# Patient Record
Sex: Female | Born: 1950 | Race: Black or African American | Hispanic: No | Marital: Married | Smoking: Former smoker
Health system: Southern US, Community
[De-identification: ages and names within clinical notes are randomized; demographics above are authoritative.]

## PROBLEM LIST (undated history)

## (undated) DIAGNOSIS — I10 Essential (primary) hypertension: Secondary | ICD-10-CM

## (undated) DIAGNOSIS — E7800 Pure hypercholesterolemia, unspecified: Secondary | ICD-10-CM

## (undated) DIAGNOSIS — E079 Disorder of thyroid, unspecified: Secondary | ICD-10-CM

## (undated) DIAGNOSIS — F0390 Unspecified dementia without behavioral disturbance: Secondary | ICD-10-CM

## (undated) DIAGNOSIS — E119 Type 2 diabetes mellitus without complications: Secondary | ICD-10-CM

## (undated) HISTORY — PX: THYROIDECTOMY: SHX17

## (undated) HISTORY — PX: ABDOMINAL HYSTERECTOMY: SHX81

---

## 1997-05-30 ENCOUNTER — Encounter: Admission: RE | Admit: 1997-05-30 | Discharge: 1997-08-28 | Payer: Self-pay | Admitting: Internal Medicine

## 2003-03-19 ENCOUNTER — Encounter: Admission: RE | Admit: 2003-03-19 | Discharge: 2003-05-15 | Payer: Self-pay | Admitting: Occupational Medicine

## 2003-04-02 ENCOUNTER — Emergency Department (HOSPITAL_COMMUNITY): Admission: EM | Admit: 2003-04-02 | Discharge: 2003-04-02 | Payer: Self-pay | Admitting: Family Medicine

## 2006-08-02 ENCOUNTER — Emergency Department (HOSPITAL_COMMUNITY): Admission: EM | Admit: 2006-08-02 | Discharge: 2006-08-02 | Payer: Self-pay | Admitting: Emergency Medicine

## 2006-08-03 ENCOUNTER — Ambulatory Visit (HOSPITAL_COMMUNITY): Admission: RE | Admit: 2006-08-03 | Discharge: 2006-08-03 | Payer: Self-pay | Admitting: Emergency Medicine

## 2007-06-25 ENCOUNTER — Emergency Department (HOSPITAL_BASED_OUTPATIENT_CLINIC_OR_DEPARTMENT_OTHER): Admission: EM | Admit: 2007-06-25 | Discharge: 2007-06-25 | Payer: Self-pay | Admitting: Emergency Medicine

## 2007-09-23 ENCOUNTER — Emergency Department (HOSPITAL_BASED_OUTPATIENT_CLINIC_OR_DEPARTMENT_OTHER): Admission: EM | Admit: 2007-09-23 | Discharge: 2007-09-23 | Payer: Self-pay | Admitting: Emergency Medicine

## 2008-02-17 ENCOUNTER — Ambulatory Visit: Payer: Self-pay | Admitting: Diagnostic Radiology

## 2008-02-17 ENCOUNTER — Emergency Department (HOSPITAL_BASED_OUTPATIENT_CLINIC_OR_DEPARTMENT_OTHER): Admission: EM | Admit: 2008-02-17 | Discharge: 2008-02-18 | Payer: Self-pay | Admitting: Emergency Medicine

## 2008-12-19 ENCOUNTER — Emergency Department (HOSPITAL_BASED_OUTPATIENT_CLINIC_OR_DEPARTMENT_OTHER): Admission: EM | Admit: 2008-12-19 | Discharge: 2008-12-19 | Payer: Self-pay | Admitting: Emergency Medicine

## 2008-12-19 ENCOUNTER — Ambulatory Visit: Payer: Self-pay | Admitting: Diagnostic Radiology

## 2009-07-10 ENCOUNTER — Emergency Department (HOSPITAL_BASED_OUTPATIENT_CLINIC_OR_DEPARTMENT_OTHER): Admission: EM | Admit: 2009-07-10 | Discharge: 2009-07-10 | Payer: Self-pay | Admitting: Emergency Medicine

## 2009-07-11 ENCOUNTER — Ambulatory Visit: Payer: Self-pay | Admitting: Diagnostic Radiology

## 2009-07-11 ENCOUNTER — Emergency Department (HOSPITAL_BASED_OUTPATIENT_CLINIC_OR_DEPARTMENT_OTHER): Admission: EM | Admit: 2009-07-11 | Discharge: 2009-07-12 | Payer: Self-pay | Admitting: Emergency Medicine

## 2010-04-14 LAB — DIFFERENTIAL
Eosinophils Absolute: 0.1 10*3/uL (ref 0.0–0.7)
Eosinophils Relative: 1 % (ref 0–5)
Lymphocytes Relative: 43 % (ref 12–46)
Neutro Abs: 2.8 10*3/uL (ref 1.7–7.7)
Neutrophils Relative %: 43 % (ref 43–77)

## 2010-04-14 LAB — URINALYSIS, ROUTINE W REFLEX MICROSCOPIC
Bilirubin Urine: NEGATIVE
Ketones, ur: NEGATIVE mg/dL
Protein, ur: NEGATIVE mg/dL
Specific Gravity, Urine: 1.019 (ref 1.005–1.030)
Urobilinogen, UA: 1 mg/dL (ref 0.0–1.0)
pH: 6.5 (ref 5.0–8.0)

## 2010-04-14 LAB — BASIC METABOLIC PANEL
BUN: 12 mg/dL (ref 6–23)
CO2: 24 mEq/L (ref 19–32)
Calcium: 9.2 mg/dL (ref 8.4–10.5)
Chloride: 108 mEq/L (ref 96–112)
Creatinine, Ser: 0.8 mg/dL (ref 0.4–1.2)
GFR calc non Af Amer: >60 mL/min (ref >60)
Potassium: 4 mEq/L (ref 3.5–5.1)
Sodium: 143 mEq/L (ref 135–145)

## 2010-04-14 LAB — CBC
HCT: 38.4 % (ref 36.0–46.0)
RBC: 4.3 MIL/uL (ref 3.87–5.11)
RDW: 13.6 % (ref 11.5–15.5)
WBC: 6.5 10*3/uL (ref 4.0–10.5)

## 2010-04-14 LAB — POCT CARDIAC MARKERS: CKMB, poc: <1 ng/mL — ABNORMAL LOW (ref 1.0–8.0)

## 2010-04-14 LAB — GLUCOSE, CAPILLARY: Glucose-Capillary: 259 mg/dL — ABNORMAL HIGH (ref 70–99)

## 2010-10-08 LAB — POCT CARDIAC MARKERS
CKMB, poc: 2.2
Myoglobin, poc: 31
Operator id: 5507

## 2010-10-08 LAB — BASIC METABOLIC PANEL
BUN: 15
Calcium: 9.6
GFR calc Af Amer: >60
GFR calc non Af Amer: >60
Glucose, Bld: 312 — ABNORMAL HIGH
Potassium: 4

## 2010-10-08 LAB — DIFFERENTIAL
Basophils Absolute: 0
Eosinophils Absolute: 0
Eosinophils Relative: 1

## 2010-10-08 LAB — CBC
HCT: 37.7
Hemoglobin: 12.7
RBC: 4.34
RDW: 13.4

## 2010-10-14 LAB — COMPREHENSIVE METABOLIC PANEL
ALT: 44 — ABNORMAL HIGH
AST: 35
Alkaline Phosphatase: 79
Chloride: 102
Creatinine, Ser: 0.6
GFR calc Af Amer: >60
Sodium: 139
Total Bilirubin: 0.3

## 2010-10-14 LAB — DIFFERENTIAL
Basophils Absolute: 0
Basophils Relative: 1
Eosinophils Absolute: 0
Monocytes Absolute: 0.5
Neutro Abs: 3.2

## 2010-10-14 LAB — POCT B-TYPE NATRIURETIC PEPTIDE (BNP): B Natriuretic Peptide, POC: 11.1

## 2010-10-14 LAB — CBC
HCT: 38.3
MCHC: 33.6
RDW: 13.4

## 2010-10-26 LAB — CBC
HCT: 38.2
Hemoglobin: 12.9
MCV: 85.3
Platelets: 272
RBC: 4.47
WBC: 6.3

## 2010-10-26 LAB — COMPREHENSIVE METABOLIC PANEL
Alkaline Phosphatase: 70
BUN: 12
CO2: 28
Chloride: 103
Creatinine, Ser: 0.65
GFR calc non Af Amer: >60
Glucose, Bld: 148 — ABNORMAL HIGH
Total Bilirubin: 0.6

## 2010-10-26 LAB — URINALYSIS, ROUTINE W REFLEX MICROSCOPIC
Nitrite: NEGATIVE
Protein, ur: NEGATIVE
Specific Gravity, Urine: 1.022
Urobilinogen, UA: 0.2

## 2010-10-26 LAB — DIFFERENTIAL
Basophils Absolute: 0
Basophils Relative: 1
Lymphocytes Relative: 49 — ABNORMAL HIGH
Neutro Abs: 2.6
Neutrophils Relative %: 41 — ABNORMAL LOW

## 2010-10-26 LAB — LIPASE, BLOOD: Lipase: 15

## 2013-04-18 ENCOUNTER — Other Ambulatory Visit: Payer: Self-pay | Admitting: Internal Medicine

## 2015-02-22 ENCOUNTER — Emergency Department (HOSPITAL_BASED_OUTPATIENT_CLINIC_OR_DEPARTMENT_OTHER): Payer: Medicare Other

## 2015-02-22 ENCOUNTER — Encounter (HOSPITAL_BASED_OUTPATIENT_CLINIC_OR_DEPARTMENT_OTHER): Payer: Self-pay | Admitting: Emergency Medicine

## 2015-02-22 ENCOUNTER — Emergency Department (HOSPITAL_BASED_OUTPATIENT_CLINIC_OR_DEPARTMENT_OTHER)
Admission: EM | Admit: 2015-02-22 | Discharge: 2015-02-22 | Disposition: A | Discharge status: Home / Self Care | Payer: Medicare Other | Attending: Emergency Medicine | Admitting: Emergency Medicine

## 2015-02-22 DIAGNOSIS — Z79899 Other long term (current) drug therapy: Secondary | ICD-10-CM | POA: Insufficient documentation

## 2015-02-22 DIAGNOSIS — I10 Essential (primary) hypertension: Secondary | ICD-10-CM | POA: Diagnosis not present

## 2015-02-22 DIAGNOSIS — R1110 Vomiting, unspecified: Secondary | ICD-10-CM | POA: Insufficient documentation

## 2015-02-22 DIAGNOSIS — E119 Type 2 diabetes mellitus without complications: Secondary | ICD-10-CM | POA: Diagnosis not present

## 2015-02-22 DIAGNOSIS — Z7902 Long term (current) use of antithrombotics/antiplatelets: Secondary | ICD-10-CM | POA: Insufficient documentation

## 2015-02-22 DIAGNOSIS — K5900 Constipation, unspecified: Secondary | ICD-10-CM | POA: Diagnosis not present

## 2015-02-22 DIAGNOSIS — E079 Disorder of thyroid, unspecified: Secondary | ICD-10-CM | POA: Insufficient documentation

## 2015-02-22 DIAGNOSIS — Z87891 Personal history of nicotine dependence: Secondary | ICD-10-CM | POA: Insufficient documentation

## 2015-02-22 HISTORY — DX: Pure hypercholesterolemia, unspecified: E78.00

## 2015-02-22 HISTORY — DX: Type 2 diabetes mellitus without complications: E11.9

## 2015-02-22 HISTORY — DX: Disorder of thyroid, unspecified: E07.9

## 2015-02-22 HISTORY — DX: Essential (primary) hypertension: I10

## 2015-02-22 LAB — CBC WITH DIFFERENTIAL/PLATELET
BASOS ABS: 0 10*3/uL (ref 0.0–0.1)
Basophils Relative: 0 %
EOS PCT: 2 %
Eosinophils Absolute: 0.1 10*3/uL (ref 0.0–0.7)
HCT: 37.7 % (ref 36.0–46.0)
Hemoglobin: 12.3 g/dL (ref 12.0–15.0)
LYMPHS PCT: 41 %
Lymphs Abs: 1.6 10*3/uL (ref 0.7–4.0)
MCH: 28.9 pg (ref 26.0–34.0)
MCHC: 32.6 g/dL (ref 30.0–36.0)
MCV: 88.5 fL (ref 78.0–100.0)
MONO ABS: 0.5 10*3/uL (ref 0.1–1.0)
MONOS PCT: 12 %
Neutro Abs: 1.7 10*3/uL (ref 1.7–7.7)
Neutrophils Relative %: 45 %
PLATELETS: 219 10*3/uL (ref 150–400)
RBC: 4.26 MIL/uL (ref 3.87–5.11)
RDW: 14.6 % (ref 11.5–15.5)
WBC: 3.8 10*3/uL — ABNORMAL LOW (ref 4.0–10.5)

## 2015-02-22 LAB — LIPASE, BLOOD: Lipase: 23 U/L (ref 11–51)

## 2015-02-22 LAB — COMPREHENSIVE METABOLIC PANEL
ALBUMIN: 3.9 g/dL (ref 3.5–5.0)
ALK PHOS: 45 U/L (ref 38–126)
ALT: 11 U/L — ABNORMAL LOW (ref 14–54)
AST: 17 U/L (ref 15–41)
Anion gap: 7 (ref 5–15)
BILIRUBIN TOTAL: 0.6 mg/dL (ref 0.3–1.2)
BUN: 13 mg/dL (ref 6–20)
CALCIUM: 9.2 mg/dL (ref 8.9–10.3)
CO2: 28 mmol/L (ref 22–32)
Chloride: 105 mmol/L (ref 101–111)
Creatinine, Ser: 0.66 mg/dL (ref 0.44–1.00)
GFR calc Af Amer: >60 mL/min (ref >60)
GFR calc non Af Amer: >60 mL/min (ref >60)
GLUCOSE: 105 mg/dL — AB (ref 65–99)
Potassium: 3.9 mmol/L (ref 3.5–5.1)
Sodium: 140 mmol/L (ref 135–145)
TOTAL PROTEIN: 6.4 g/dL — AB (ref 6.5–8.1)

## 2015-02-22 LAB — URINALYSIS, ROUTINE W REFLEX MICROSCOPIC
BILIRUBIN URINE: NEGATIVE
GLUCOSE, UA: NEGATIVE mg/dL
HGB URINE DIPSTICK: NEGATIVE
KETONES UR: NEGATIVE mg/dL
Leukocytes, UA: NEGATIVE
NITRITE: NEGATIVE
PH: 7 (ref 5.0–8.0)
Protein, ur: NEGATIVE mg/dL
SPECIFIC GRAVITY, URINE: 1.012 (ref 1.005–1.030)

## 2015-02-22 MED ORDER — POLYETHYLENE GLYCOL 3350 17 GM/SCOOP PO POWD: 17.0000 g | Freq: Two times a day (BID) | ORAL | Status: DC

## 2015-02-22 MED ORDER — IOHEXOL 300 MG/ML  SOLN
100.0000 mL | Freq: Once | INTRAMUSCULAR | Status: AC | PRN
  Administered 2015-02-22: 100 mL via INTRAVENOUS

## 2015-02-22 MED ORDER — LORAZEPAM 2 MG/ML IJ SOLN
1.0000 mg | Freq: Once | INTRAMUSCULAR | Status: AC
  Administered 2015-02-22: 1 mg via INTRAVENOUS
  Filled 2015-02-22: qty 1

## 2015-02-22 MED ORDER — IOHEXOL 300 MG/ML  SOLN
25.0000 mL | Freq: Once | INTRAMUSCULAR | Status: AC | PRN
  Administered 2015-02-22: 25 mL via ORAL

## 2015-02-22 NOTE — ED Provider Notes (Signed)
CSN: [id number]     Arrival date & time 02/22/15  4098 History   First MD Initiated Contact with Patient 02/22/15 0919     Chief Complaint  Patient presents with  . Constipation    April Carlson is a 65 y.o. female who presents to the ED complaining of constipation for two weeks. She reports she has not had a good BM in two weeks. She reports passing a small amount of stool yesterday. She reports nausea starting yesterday with two episodes of vomiting yesterday. She reports passing a small amount of gas yesterday. She complains of lower abdominal pain with pain that radiates into her back that feels like an urge to have a BM at a 10/10. She was seen by her PCP three days ago and was given mag citrate and colace. She has used fleet enemas as well without BM. She takes percocet intermittently for sciatica. Previous abdominal surgical history includes complete hysterectomy. She denies fevers, urinary symptoms, hematemesis, hematochezia, or rashes.   Patient is a 65 y.o. female presenting with constipation. The history is provided by the patient. No language interpreter was used.  Constipation Associated symptoms: abdominal pain and vomiting   Associated symptoms: no back pain, no diarrhea, no dysuria, no fever and no nausea     Past Medical History  Diagnosis Date  . Thyroid disease   . Hypertension   . Diabetes mellitus without complication (HCC)   . High cholesterol    Past Surgical History  Procedure Laterality Date  . Abdominal hysterectomy     No family history on file. Social History  Substance Use Topics  . Smoking status: Former Smoker    Types: Cigarettes  . Smokeless tobacco: None  . Alcohol Use: No   OB History    No data available     Review of Systems  Constitutional: Negative for fever, chills and appetite change.  HENT: Negative for congestion and sore throat.   Eyes: Negative for visual disturbance.  Respiratory: Negative for cough and shortness of breath.    Cardiovascular: Negative for chest pain.  Gastrointestinal: Positive for vomiting, abdominal pain, constipation and rectal pain. Negative for nausea, diarrhea, blood in stool and anal bleeding.  Genitourinary: Negative for dysuria, urgency, frequency, hematuria, flank pain, decreased urine volume and difficulty urinating.  Musculoskeletal: Negative for back pain and neck pain.  Skin: Negative for rash.  Neurological: Negative for headaches.      Allergies  Review of patient's allergies indicates no known allergies.  Home Medications   Prior to Admission medications   Medication Sig Start Date End Date Taking? Authorizing Provider  alendronate (FOSAMAX) 70 MG tablet Take 70 mg by mouth once a week. Take with a full glass of water on an empty stomach.   Yes Historical Provider, MD  clopidogrel (PLAVIX) 75 MG tablet Take 75 mg by mouth daily.   Yes Historical Provider, MD  Dulaglutide (TRULICITY [place]) Inject into the skin.   Yes Historical Provider, MD  gabapentin (NEURONTIN) 100 MG capsule Take 100 mg by mouth 3 (three) times daily.   Yes Historical Provider, MD  levothyroxine (SYNTHROID, LEVOTHROID) 100 MCG tablet Take 125 mcg by mouth daily before breakfast.   Yes Historical Provider, MD  lisinopril (PRINIVIL,ZESTRIL) 10 MG tablet Take 10 mg by mouth daily.   Yes Historical Provider, MD  lisinopril-hydrochlorothiazide (PRINZIDE,ZESTORETIC) 10-12.5 MG tablet Take 1 tablet by mouth daily.   Yes Historical Provider, MD  oxyCODONE-acetaminophen (PERCOCET) 10-325 MG tablet Take 1 tablet by mouth  every 4 (four) hours as needed for pain.   Yes Historical Provider, MD  polyethylene glycol powder (GLYCOLAX/MIRALAX) powder Take 17 g by mouth 2 (two) times daily. Until daily soft stools  OTC 02/22/15   Everlene Farrier, PA-C   BP 148/90 mmHg  Pulse 77  Temp(Src) 98.3 F (36.8 C) (Oral)  Resp 18  Ht  (1.499 m)  Wt 77.111 kg  BMI 34.32 kg/m2  SpO2 100% Physical Exam  Constitutional: She  appears well-developed and well-nourished. No distress.  Nontoxic appearing.  HENT:  Head: Normocephalic and atraumatic.  Mouth/Throat: Oropharynx is clear and moist.  Eyes: Conjunctivae are normal. Pupils are equal, round, and reactive to light. Right eye exhibits no discharge. Left eye exhibits no discharge.  Neck: Neck supple.  Cardiovascular: Normal rate, regular rhythm, normal heart sounds and intact distal pulses.  Exam reveals no gallop and no friction rub.   No murmur heard. Pulmonary/Chest: Effort normal and breath sounds normal. No respiratory distress. She has no wheezes. She has no rales.  Abdominal: Soft. Bowel sounds are normal. She exhibits no distension and no mass. There is tenderness. There is no rebound and no guarding.  Abdomen soft. Bowel sounds are present. Mild suprapubic abdominal tenderness to palpation. No peritoneal signs. No CVA or flank tenderness.   Genitourinary:  Digital rectal exam performed by me with female RN chaperone. No external hemorrhoids noted. No gross bloody stool. No impaction. Soft stool in her rectal vault.  Musculoskeletal: She exhibits no edema.  Lymphadenopathy:    She has no cervical adenopathy.  Neurological: She is alert. Coordination normal.  Skin: Skin is warm and dry. No rash noted. She is not diaphoretic. No erythema. No pallor.  Psychiatric: She has a normal mood and affect. Her behavior is normal.  Nursing note and vitals reviewed.   ED Course  Procedures (including critical care time) Labs Review Labs Reviewed  COMPREHENSIVE METABOLIC PANEL - Abnormal; Notable for the following:    Glucose, Bld 105 (*)    Total Protein 6.4 (*)    ALT 11 (*)    All other components within normal limits  CBC WITH DIFFERENTIAL/PLATELET - Abnormal; Notable for the following:    WBC 3.8 (*)    All other components within normal limits  LIPASE, BLOOD  URINALYSIS, ROUTINE W REFLEX MICROSCOPIC (NOT AT [facility])    Imaging Review Ct Abdomen Pelvis  W Contrast  02/22/2015  CLINICAL DATA:  Constipation with abdominal pain EXAM: CT ABDOMEN AND PELVIS WITH CONTRAST TECHNIQUE: Multidetector CT imaging of the abdomen and pelvis was performed using the standard protocol following bolus administration of intravenous contrast. Oral contrast was also administered. CONTRAST:  OMNIPAQUE IOHEXOL 300 MG/ML SOLN, 25mL OMNIPAQUE IOHEXOL 300 MG/ML SOLN COMPARISON:  May 16, 2014 FINDINGS: Lower chest:  Lung bases are clear.  There is a small hiatal hernia. Hepatobiliary: There is a tiny cyst in the anterior segment of the right lobe of the liver near the dome. No other focal liver lesions are identified. The gallbladder wall is not appreciably thickened. There is no biliary duct dilatation. Pancreas: No pancreatic mass or inflammatory focus. Spleen: No splenic lesions are identified. Adrenals/Urinary Tract: Adrenals appear normal bilaterally. Kidneys bilaterally show no mass or hydronephrosis on either side. There is no renal or ureteral calculus on either side. The urinary bladder is midline with wall thickness within normal limits. Stomach/Bowel: There is diffuse stool throughout the colon. There are scattered sigmoid diverticula without diverticulitis. There is no bowel wall  or mesenteric thickening. There is no bowel obstruction. No free air or portal venous air. 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 There is enlargement of the left hemiazygous vein with vessel crossing to the right where the inferior vena cava fills at the level of the renal veins. There is no abdominal aortic aneurysm. The major mesenteric vessels appear patent. There are scattered foci of calcification in the aorta and common iliac arteries. There is no adenopathy in the abdomen or pelvis. There are several small calcified lymph nodes in the right common iliac chain. Reproductive: Uterus is absent. There is no pelvic mass or pelvic fluid collection. Other:  Appendix is not appreciable. There is no periappendiceal region inflammation. There is a minimal ventral hernia containing only fat. There is no abscess or ascites in the abdomen or pelvis. Musculoskeletal: There is degenerative change in the lower thoracic spine. There is mild degenerative type change in the lumbar region as well. There is an apparent bone island in the right acetabulum. There are no lytic or destructive bone lesions. There is no intramuscular or abdominal wall lesion. IMPRESSION: Diffuse stool throughout the colon. No bowel wall thickening or bowel obstruction. Scattered sigmoid diverticula without diverticulitis. Small hiatal hernia.  Minimal ventral hernia containing only fat. 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 The inferior vena cava reconstitutes on the right at the level of the renal veins. No adenopathy. Several small right common iliac node chain lymph nodes show calcification. No abscess. No periappendiceal region inflammation. No renal or ureteral calculus. No hydronephrosis. Electronically Signed   By: Bretta Bang III M.D.   On: 02/22/2015 11:15   I have personally reviewed and evaluated these images and lab results as part of my medical decision-making.   EKG Interpretation None      Filed Vitals:   02/22/15 0923  BP: 148/90  Pulse: 77  Temp: 98.3 F (36.8 C)  TempSrc: Oral  Resp: 18  Height:  (1.499 m)  Weight: 77.111 kg  SpO2: 100%     MDM   Meds given in ED:  Medications  LORazepam (ATIVAN) injection 1 mg (1 mg Intravenous Given 02/22/15 1036)  iohexol (OMNIPAQUE) 300 MG/ML solution 25 mL (25 mLs Oral Contrast Given 02/22/15 1045)  iohexol (OMNIPAQUE) 300 MG/ML solution 100 mL (100 mLs Intravenous Contrast Given 02/22/15 1045)    New Prescriptions   POLYETHYLENE GLYCOL POWDER (GLYCOLAX/MIRALAX) POWDER    Take 17 g by mouth 2 (two) times daily. Until daily soft stools  OTC     Final diagnoses:  Constipation, unspecified constipation type   This  is a 65 y.o. female who presents to the ED complaining of constipation for two weeks. She reports she has not had a good BM in two weeks. She reports passing a small amount of stool yesterday. She reports nausea starting yesterday with two episodes of vomiting yesterday. She reports passing a small amount of gas yesterday. She complains of lower abdominal pain with pain that radiates into her back that feels like an urge to have a BM at a 10/10.  On exam patient is afebrile nontoxic-appearing. Her abdomen is soft and she has mild suprapubic abdominal tenderness to palpation. No peritoneal signs. On digital rectal exam patient has no sign of hemorrhoids. She has soft stool in her rectal vault. No gross bloody stool. No impaction. We'll obtain CT abdomen and pelvis and blood work. Urinalysis is  unremarkable. CMP and CBC are unremarkable. Lipase is within normal limits at 23. CT abdomen and pelvis with contrast indicated diffuse stool throughout the colon. No bowel wall thickening or bowel obstruction. At reevaluation, patient reports feeling much better. She reports having a small bowel movement after drinking the oral contrast. She reports she feels ready to go home and try a large dose of MiraLAX to have further bowl movements. She does not feel nauseated and has no abdominal pain currently. She reports feeling better. Will discharge with MiraLAX and I encouraged her to take 8 scoops with 32 ounces of water once to stimulate further BM at home. I discussed using a high fiber diet. I advised the patient to follow-up with their primary care provider this week. I advised the patient to return to the emergency department with new or worsening symptoms or new concerns. The patient verbalized understanding and agreement with plan.    This patient was discussed with and evaluated by Dr. Adela Lank who agrees with assessment and plan.    Everlene Farrier, PA-C 02/22/15 1203  Melene Plan, DO 02/22/15 1525

## 2015-02-22 NOTE — ED Notes (Signed)
Pt states no bowel movement for 2 weeks.  Tried laxatives at home and a fleet enema with no results.  Saw PMD on Thursday, given mag citrate - no relief.  Pt states abdominal discomfort and pains in her back.  Passing very small amounts of gas.  +nausea with one episode of vomiting last night around 3am.

## 2015-02-22 NOTE — Discharge Instructions (Signed)
Constipation, Adult °Constipation is when a person has fewer than three bowel movements a week, has difficulty having a bowel movement, or has stools that are dry, hard, or larger than normal. As people grow older, constipation is more common. A low-fiber diet, not taking in enough fluids, and taking certain medicines may make constipation worse.  °CAUSES  °· Certain medicines, such as antidepressants, pain medicine, iron supplements, antacids, and water pills.   °· Certain diseases, such as diabetes, irritable bowel syndrome (IBS), thyroid disease, or depression.   °· Not drinking enough water.   °· Not eating enough fiber-rich foods.   °· Stress or travel.   °· Lack of physical activity or exercise.   °· Ignoring the urge to have a bowel movement.   °· Using laxatives too much.   °SIGNS AND SYMPTOMS  °· Having fewer than three bowel movements a week.   °· Straining to have a bowel movement.   °· Having stools that are hard, dry, or larger than normal.   °· Feeling full or bloated.   °· Pain in the lower abdomen.   °· Not feeling relief after having a bowel movement.   °DIAGNOSIS  °Your health care provider will take a medical history and perform a physical exam. Further testing may be done for severe constipation. Some tests may include: °· A barium enema X-ray to examine your rectum, colon, and, sometimes, your small intestine.   °· A sigmoidoscopy to examine your lower colon.   °· A colonoscopy to examine your entire colon. °TREATMENT  °Treatment will depend on the severity of your constipation and what is causing it. Some dietary treatments include drinking more fluids and eating more fiber-rich foods. Lifestyle treatments may include regular exercise. If these diet and lifestyle recommendations do not help, your health care provider may recommend taking over-the-counter laxative medicines to help you have bowel movements. Prescription medicines may be prescribed if over-the-counter medicines do not work.    °HOME CARE INSTRUCTIONS  °· Eat foods that have a lot of fiber, such as fruits, vegetables, whole grains, and beans. °· Limit foods high in fat and processed sugars, such as french fries, hamburgers, cookies, candies, and soda.   °· A fiber supplement may be added to your diet if you cannot get enough fiber from foods.   °· Drink enough fluids to keep your urine clear or pale yellow.   °· Exercise regularly or as directed by your health care provider.   °· Go to the restroom when you have the urge to go. Do not hold it.   °· Only take over-the-counter or prescription medicines as directed by your health care provider. Do not take other medicines for constipation without talking to your health care provider first.   °SEEK IMMEDIATE MEDICAL CARE IF:  °· You have bright red blood in your stool.   °· Your constipation lasts for more than 4 days or gets worse.   °· You have abdominal or rectal pain.   °· You have thin, pencil-like stools.   °· You have unexplained weight loss. °MAKE SURE YOU:  °· Understand these instructions. °· Will watch your condition. °· Will get help right away if you are not doing well or get worse. °  °This information is not intended to replace advice given to you by your health care provider. Make sure you discuss any questions you have with your health care provider. °  °Document Released: 09/26/2003 Document Revised: 01/18/2014 Document Reviewed: 10/09/2012 °Elsevier Interactive Patient Education ©2016 [employer] ° °High-Fiber Diet °Fiber, also called dietary fiber, is a type of carbohydrate found in fruits, vegetables, whole grains, and   beans. A high-fiber diet can have many health benefits. Your health care provider may recommend a high-fiber diet to help: °· Prevent constipation. Fiber can make your bowel movements more regular. °· Lower your cholesterol. °· Relieve hemorrhoids, uncomplicated diverticulosis, or irritable bowel syndrome. °· Prevent overeating as part of a weight-loss  plan. °· Prevent heart disease, type 2 diabetes, and certain cancers. °WHAT IS MY PLAN? °The recommended daily intake of fiber includes: °· 38 grams for men under age 50. °· 30 grams for men over age 50. °· 25 grams for women under age 50. °· 21 grams for women over age 50. °You can get the recommended daily intake of dietary fiber by eating a variety of fruits, vegetables, grains, and beans. Your health care provider may also recommend a fiber supplement if it is not possible to get enough fiber through your diet. °WHAT DO I NEED TO KNOW ABOUT A HIGH-FIBER DIET? °· Fiber supplements have not been widely studied for their effectiveness, so it is better to get fiber through food sources. °· Always check the fiber content on the nutrition facts label of any prepackaged food. Look for foods that contain at least 5 grams of fiber per serving. °· Ask your dietitian if you have questions about specific foods that are related to your condition, especially if those foods are not listed in the following section. °· Increase your daily fiber consumption gradually. Increasing your intake of dietary fiber too quickly may cause bloating, cramping, or gas. °· Drink plenty of water. Water helps you to digest fiber. °WHAT FOODS CAN I EAT? °Grains °Whole-grain breads. Multigrain cereal. Oats and oatmeal. Brown rice. Barley. Bulgur wheat. Millet. Bran muffins. Popcorn. Rye wafer crackers. °Vegetables °Sweet potatoes. Spinach. Kale. Artichokes. Cabbage. Broccoli. Green peas. Carrots. Squash. °Fruits °Berries. Pears. Apples. Oranges. Avocados. Prunes and raisins. Dried figs. °Meats and Other Protein Sources °Navy, kidney, pinto, and soy beans. Split peas. Lentils. Nuts and seeds. °Dairy °Fiber-fortified yogurt. °Beverages °Fiber-fortified soy milk. Fiber-fortified orange juice. °Other °Fiber bars. °The items listed above may not be a complete list of recommended foods or beverages. Contact your dietitian for more options. °WHAT FOODS  ARE NOT RECOMMENDED? °Grains °White bread. Pasta made with refined flour. White rice. °Vegetables °Fried potatoes. Canned vegetables. Well-cooked vegetables.  °Fruits °Fruit juice. Cooked, strained fruit. °Meats and Other Protein Sources °Fatty cuts of meat. Fried poultry or fried fish. °Dairy °Milk. Yogurt. Cream cheese. Sour cream. °Beverages °Soft drinks. °Other °Cakes and pastries. Butter and oils. °The items listed above may not be a complete list of foods and beverages to avoid. Contact your dietitian for more information. °WHAT ARE SOME TIPS FOR INCLUDING HIGH-FIBER FOODS IN MY DIET? °· Eat a wide variety of high-fiber foods. °· Make sure that half of all grains consumed each day are whole grains. °· Replace breads and cereals made from refined flour or white flour with whole-grain breads and cereals. °· Replace white rice with brown rice, bulgur wheat, or millet. °· Start the day with a breakfast that is high in fiber, such as a cereal that contains at least 5 grams of fiber per serving. °· Use beans in place of meat in soups, salads, or pasta. °· Eat high-fiber snacks, such as berries, raw vegetables, nuts, or popcorn. °  °This information is not intended to replace advice given to you by your health care provider. Make sure you discuss any questions you have with your health care provider. °  °Document Released: 12/28/2004 Document Revised: 01/18/2014 Document   Reviewed: 06/12/2013 °Elsevier Interactive Patient Education ©2016 [employer] ° °

## 2015-05-30 ENCOUNTER — Encounter (HOSPITAL_BASED_OUTPATIENT_CLINIC_OR_DEPARTMENT_OTHER): Payer: Self-pay | Admitting: *Deleted

## 2015-05-30 ENCOUNTER — Emergency Department (HOSPITAL_BASED_OUTPATIENT_CLINIC_OR_DEPARTMENT_OTHER)
Admission: EM | Admit: 2015-05-30 | Discharge: 2015-05-30 | Disposition: A | Discharge status: Home / Self Care | Payer: No Typology Code available for payment source | Attending: Emergency Medicine | Admitting: Emergency Medicine

## 2015-05-30 ENCOUNTER — Emergency Department (HOSPITAL_BASED_OUTPATIENT_CLINIC_OR_DEPARTMENT_OTHER): Payer: No Typology Code available for payment source

## 2015-05-30 DIAGNOSIS — Z87891 Personal history of nicotine dependence: Secondary | ICD-10-CM | POA: Insufficient documentation

## 2015-05-30 DIAGNOSIS — E119 Type 2 diabetes mellitus without complications: Secondary | ICD-10-CM | POA: Diagnosis not present

## 2015-05-30 DIAGNOSIS — S8992XA Unspecified injury of left lower leg, initial encounter: Secondary | ICD-10-CM | POA: Diagnosis present

## 2015-05-30 DIAGNOSIS — Z79899 Other long term (current) drug therapy: Secondary | ICD-10-CM | POA: Diagnosis not present

## 2015-05-30 DIAGNOSIS — Y939 Activity, unspecified: Secondary | ICD-10-CM | POA: Insufficient documentation

## 2015-05-30 DIAGNOSIS — Y999 Unspecified external cause status: Secondary | ICD-10-CM | POA: Insufficient documentation

## 2015-05-30 DIAGNOSIS — S8012XA Contusion of left lower leg, initial encounter: Secondary | ICD-10-CM | POA: Diagnosis not present

## 2015-05-30 DIAGNOSIS — I10 Essential (primary) hypertension: Secondary | ICD-10-CM | POA: Diagnosis not present

## 2015-05-30 DIAGNOSIS — Y929 Unspecified place or not applicable: Secondary | ICD-10-CM | POA: Diagnosis not present

## 2015-05-30 DIAGNOSIS — M79605 Pain in left leg: Secondary | ICD-10-CM

## 2015-05-30 DIAGNOSIS — E079 Disorder of thyroid, unspecified: Secondary | ICD-10-CM | POA: Diagnosis not present

## 2015-05-30 MED ORDER — IBUPROFEN 400 MG PO TABS
600.0000 mg | ORAL_TABLET | Freq: Once | ORAL | Status: AC
  Administered 2015-05-30: 600 mg via ORAL
  Filled 2015-05-30: qty 1
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
## 2015-05-30 NOTE — Discharge Instructions (Signed)
Ice the area of pain 3-4 times daily alternating 20 minutes on, 20 minutes off, take ibuprofen or Tylenol every 4-6 hours as needed for your pain. Elevate your leg whenever you are sitting or laying down. Please follow-up with your primary care provider or Dr. Pearletha ForgeHudnall further evaluation and treatment if her symptoms do not improve. Please return to emergency department if you develop any new or worsening symptoms.   Contusion A contusion is a deep bruise. Contusions are the result of a blunt injury to tissues and muscle fibers under the skin. The injury causes bleeding under the skin. The skin overlying the contusion may turn blue, purple, or yellow. Minor injuries will give you a painless contusion, but more severe contusions may stay painful and swollen for a few weeks.  CAUSES  This condition is usually caused by a blow, trauma, or direct force to an area of the body. SYMPTOMS  Symptoms of this condition include:  Swelling of the injured area.  Pain and tenderness in the injured area.  Discoloration. The area may have redness and then turn blue, purple, or yellow. DIAGNOSIS  This condition is diagnosed based on a physical exam and medical history. An X-ray, CT scan, or MRI may be needed to determine if there are any associated injuries, such as broken bones (fractures). TREATMENT  Specific treatment for this condition depends on what area of the body was injured. In general, the best treatment for a contusion is resting, icing, applying pressure to (compression), and elevating the injured area. This is often called the RICE strategy. Over-the-counter anti-inflammatory medicines may also be recommended for pain control.  HOME CARE INSTRUCTIONS   Rest the injured area.  If directed, apply ice to the injured area:  Put ice in a plastic bag.  Place a towel between your skin and the bag.  Leave the ice on for 20 minutes, 2-3 times per day.  If directed, apply light compression to the  injured area using an elastic bandage. Make sure the bandage is not wrapped too tightly. Remove and reapply the bandage as directed by your health care provider.  If possible, raise (elevate) the injured area above the level of your heart while you are sitting or lying down.  Take over-the-counter and prescription medicines only as told by your health care provider. SEEK MEDICAL CARE IF:  Your symptoms do not improve after several days of treatment.  Your symptoms get worse.  You have difficulty moving the injured area. SEEK IMMEDIATE MEDICAL CARE IF:   You have severe pain.  You have numbness in a hand or foot.  Your hand or foot turns pale or cold.   This information is not intended to replace advice given to you by your health care provider. Make sure you discuss any questions you have with your health care provider.   Document Released: 10/07/2004 Document Revised: 09/18/2014 Document Reviewed: 05/15/2014 Elsevier Interactive Patient Education 2016 [employer]  Cryotherapy Cryotherapy means treatment with cold. Ice or gel packs can be used to reduce both pain and swelling. Ice is the most helpful within the first 24 to 48 hours after an injury or flare-up from overusing a muscle or joint. Sprains, strains, spasms, burning pain, shooting pain, and aches can all be eased with ice. Ice can also be used when recovering from surgery. Ice is effective, has very few side effects, and is safe for most people to use. PRECAUTIONS  Ice is not a safe treatment option for people with:  Raynaud phenomenon. This is a condition affecting small blood vessels in the extremities. Exposure to cold may cause your problems to return.  Cold hypersensitivity. There are many forms of cold hypersensitivity, including:  Cold urticaria. Red, itchy hives appear on the skin when the tissues begin to warm after being iced.  Cold erythema. This is a red, itchy rash caused by exposure to cold.  Cold  hemoglobinuria. Red blood cells break down when the tissues begin to warm after being iced. The hemoglobin that carry oxygen are passed into the urine because they cannot combine with blood proteins fast enough.  Numbness or altered sensitivity in the area being iced. If you have any of the following conditions, do not use ice until you have discussed cryotherapy with your caregiver:  Heart conditions, such as arrhythmia, angina, or chronic heart disease.  High blood pressure.  Healing wounds or open skin in the area being iced.  Current infections.  Rheumatoid arthritis.  Poor circulation.  Diabetes. Ice slows the blood flow in the region it is applied. This is beneficial when trying to stop inflamed tissues from spreading irritating chemicals to surrounding tissues. However, if you expose your skin to cold temperatures for too long or without the proper protection, you can damage your skin or nerves. Watch for signs of skin damage due to cold. HOME CARE INSTRUCTIONS Follow these tips to use ice and cold packs safely.  Place a dry or damp towel between the ice and skin. A damp towel will cool the skin more quickly, so you may need to shorten the time that the ice is used.  For a more rapid response, add gentle compression to the ice.  Ice for no more than 10 to 20 minutes at a time. The bonier the area you are icing, the less time it will take to get the benefits of ice.  Check your skin after 5 minutes to make sure there are no signs of a poor response to cold or skin damage.  Rest 20 minutes or more between uses.  Once your skin is numb, you can end your treatment. You can test numbness by very lightly touching your skin. The touch should be so light that you do not see the skin dimple from the pressure of your fingertip. When using ice, most people will feel these normal sensations in this order: cold, burning, aching, and numbness.  Do not use ice on someone who cannot  communicate their responses to pain, such as small children or people with dementia. HOW TO MAKE AN ICE PACK Ice packs are the most common way to use ice therapy. Other methods include ice massage, ice baths, and cryosprays. Muscle creams that cause a cold, tingly feeling do not offer the same benefits that ice offers and should not be used as a substitute unless recommended by your caregiver. To make an ice pack, do one of the following:  Place crushed ice or a bag of frozen vegetables in a sealable plastic bag. Squeeze out the excess air. Place this bag inside another plastic bag. Slide the bag into a pillowcase or place a damp towel between your skin and the bag.  Mix 3 parts water with 1 part rubbing alcohol. Freeze the mixture in a sealable plastic bag. When you remove the mixture from the freezer, it will be slushy. Squeeze out the excess air. Place this bag inside another plastic bag. Slide the bag into a pillowcase or place a damp towel between your skin  and the bag. SEEK MEDICAL CARE IF:  You develop white spots on your skin. This may give the skin a blotchy (mottled) appearance.  Your skin turns blue or pale.  Your skin becomes waxy or hard.  Your swelling gets worse. MAKE SURE YOU:   Understand these instructions.  Will watch your condition.  Will get help right away if you are not doing well or get worse.   This information is not intended to replace advice given to you by your health care provider. Make sure you discuss any questions you have with your health care provider.   Document Released: 08/24/2010 Document Revised: 01/18/2014 Document Reviewed: 08/24/2010 Elsevier Interactive Patient Education [employer]
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
## 2018-08-10 ENCOUNTER — Emergency Department (HOSPITAL_BASED_OUTPATIENT_CLINIC_OR_DEPARTMENT_OTHER)
Admission: EM | Admit: 2018-08-10 | Discharge: 2018-08-10 | Disposition: A | Discharge status: Home / Self Care | Payer: Medicare Other | Attending: Emergency Medicine | Admitting: Emergency Medicine

## 2018-08-10 ENCOUNTER — Emergency Department (HOSPITAL_BASED_OUTPATIENT_CLINIC_OR_DEPARTMENT_OTHER): Payer: Medicare Other

## 2018-08-10 ENCOUNTER — Encounter (HOSPITAL_BASED_OUTPATIENT_CLINIC_OR_DEPARTMENT_OTHER): Payer: Self-pay

## 2018-08-10 ENCOUNTER — Other Ambulatory Visit: Payer: Self-pay

## 2018-08-10 DIAGNOSIS — Z87891 Personal history of nicotine dependence: Secondary | ICD-10-CM | POA: Insufficient documentation

## 2018-08-10 DIAGNOSIS — M25552 Pain in left hip: Secondary | ICD-10-CM | POA: Diagnosis not present

## 2018-08-10 DIAGNOSIS — M545 Low back pain, unspecified: Secondary | ICD-10-CM

## 2018-08-10 DIAGNOSIS — Z79899 Other long term (current) drug therapy: Secondary | ICD-10-CM | POA: Diagnosis not present

## 2018-08-10 DIAGNOSIS — Z7984 Long term (current) use of oral hypoglycemic drugs: Secondary | ICD-10-CM | POA: Insufficient documentation

## 2018-08-10 DIAGNOSIS — E119 Type 2 diabetes mellitus without complications: Secondary | ICD-10-CM | POA: Diagnosis not present

## 2018-08-10 DIAGNOSIS — I10 Essential (primary) hypertension: Secondary | ICD-10-CM | POA: Insufficient documentation

## 2018-08-10 LAB — CBC WITH DIFFERENTIAL/PLATELET
Abs Immature Granulocytes: 0.01 10*3/uL (ref 0.00–0.07)
Basophils Absolute: 0.1 10*3/uL (ref 0.0–0.1)
Basophils Relative: 2 %
Eosinophils Absolute: 0 10*3/uL (ref 0.0–0.5)
Eosinophils Relative: 1 %
HCT: 40 % (ref 36.0–46.0)
Hemoglobin: 13 g/dL (ref 12.0–15.0)
Immature Granulocytes: 0 %
Lymphocytes Relative: 49 %
Lymphs Abs: 1.8 10*3/uL (ref 0.7–4.0)
MCH: 29 pg (ref 26.0–34.0)
MCHC: 32.5 g/dL (ref 30.0–36.0)
MCV: 89.3 fL (ref 80.0–100.0)
Monocytes Absolute: 0.3 10*3/uL (ref 0.1–1.0)
Monocytes Relative: 9 %
Neutro Abs: 1.4 10*3/uL — ABNORMAL LOW (ref 1.7–7.7)
Neutrophils Relative %: 39 %
Platelets: 247 10*3/uL (ref 150–400)
RBC: 4.48 MIL/uL (ref 3.87–5.11)
RDW: 13.8 % (ref 11.5–15.5)
WBC: 3.6 10*3/uL — ABNORMAL LOW (ref 4.0–10.5)
nRBC: 0 % (ref 0.0–0.2)

## 2018-08-10 LAB — BASIC METABOLIC PANEL
Anion gap: 9 (ref 5–15)
BUN: 11 mg/dL (ref 8–23)
CO2: 24 mmol/L (ref 22–32)
Calcium: 9.1 mg/dL (ref 8.9–10.3)
Chloride: 104 mmol/L (ref 98–111)
Creatinine, Ser: 0.71 mg/dL (ref 0.44–1.00)
GFR calc Af Amer: >60 mL/min (ref >60)
GFR calc non Af Amer: >60 mL/min (ref >60)
Glucose, Bld: 111 mg/dL — ABNORMAL HIGH (ref 70–99)
Potassium: 3.6 mmol/L (ref 3.5–5.1)
Sodium: 137 mmol/L (ref 135–145)

## 2018-08-10 MED ORDER — METHOCARBAMOL 500 MG PO TABS: 500.0000 mg | ORAL_TABLET | Freq: Four times a day (QID) | ORAL | 0 refills | Status: DC | PRN

## 2018-08-10 MED ORDER — IBUPROFEN 400 MG PO TABS: 400.0000 mg | ORAL_TABLET | Freq: Four times a day (QID) | ORAL | 0 refills | Status: DC | PRN

## 2018-08-10 MED ORDER — SODIUM CHLORIDE 0.9 % IV BOLUS
500.0000 mL | Freq: Once | INTRAVENOUS | Status: AC
  Administered 2018-08-10: 500 mL via INTRAVENOUS

## 2018-08-10 MED ORDER — MORPHINE SULFATE (PF) 4 MG/ML IV SOLN
4.0000 mg | Freq: Once | INTRAVENOUS | Status: AC
  Administered 2018-08-10: 4 mg via INTRAVENOUS
  Filled 2018-08-10: qty 1

## 2018-08-10 NOTE — ED Notes (Signed)
Patient transported to X-ray 

## 2018-08-10 NOTE — Discharge Instructions (Signed)
Take motrin for pain   Take robaxin for muscle spasms   See your doctor  Return to ER if you have worse pain, trouble walking, numbness, weakness

## 2018-08-10 NOTE — ED Triage Notes (Signed)
c/o right lower back pain that radiates down right LE-started yesterday-denies injury-to triage in w/c-NAD

## 2018-08-10 NOTE — ED Provider Notes (Signed)
  Physical Exam  BP (!) 147/74 (BP Location: Left Arm)   Pulse 66   Temp 98.7 F (37.1 C) (Oral)   Resp 18   Ht 4\' 10"  (1.473 m)   Wt 77.1 kg   SpO2 97%   BMI 35.53 kg/m   Physical Exam  ED Course/Procedures     Procedures  MDM  Care assumed at 3 pm. Patient here with back pain and L hip pain. Sign out pending xrays and ambulation.   5:16 PM xrays showed arthritis. Ambulated by herself. Stable for discharge. Will give NSAIDs, flexeril       Drenda Freeze, MD 08/10/18 [phone]

## 2018-08-10 NOTE — ED Provider Notes (Signed)
[facility] EMERGENCY DEPARTMENT Provider Note   CSN: [id number] Arrival date & time: 08/10/18  1304    History   Chief Complaint Chief Complaint  Patient presents with  . Back Pain    HPI April Carlson is a 68 y.o. female.     HPI 68 year old female presents with right low back pain.  Started last night and is worse this morning.  When she first awoke, the pain was much worse than last night.  She also had tingling in her bilateral fingertips.  No weakness or numbness in her arms or legs.  There is no weakness in her legs but her right leg is so painful is hard to walk.  Pain radiates down the posterior aspect of her leg to her knee.  No incontinence or fever.  No direct trauma.  Past Medical History:  Diagnosis Date  . Diabetes mellitus without complication (HCC)   . High cholesterol   . Hypertension   . Thyroid disease     There are no active problems to display for this patient.   Past Surgical History:  Procedure Laterality Date  . ABDOMINAL HYSTERECTOMY       OB History   No obstetric history on file.      Home Medications    Prior to Admission medications   Medication Sig Start Date End Date Taking? Authorizing Provider  alendronate (FOSAMAX) 70 MG tablet Take 70 mg by mouth once a week. Take with a full glass of water on an empty stomach.    [provider]  clopidogrel (PLAVIX) 75 MG tablet Take 75 mg by mouth daily.    [provider]  Dulaglutide (TRULICITY [place]) Inject into the skin.    [provider]  gabapentin (NEURONTIN) 100 MG capsule Take 100 mg by mouth 3 (three) times daily.    [provider]  levothyroxine (SYNTHROID, LEVOTHROID) 100 MCG tablet Take 125 mcg by mouth daily before breakfast.    [provider]  lisinopril (PRINIVIL,ZESTRIL) 10 MG tablet Take 10 mg by mouth daily.    [provider]  lisinopril-hydrochlorothiazide (PRINZIDE,ZESTORETIC) 10-12.5 MG tablet Take 1  tablet by mouth daily.    [provider]  oxyCODONE-acetaminophen (PERCOCET) 10-325 MG tablet Take 1 tablet by mouth every 4 (four) hours as needed for pain.    [provider]  polyethylene glycol powder (GLYCOLAX/MIRALAX) powder Take 17 g by mouth 2 (two) times daily. Until daily soft stools  OTC 02/22/15   Everlene Farrieransie, William, PA-C    Family History No family history on file.  Social History Social History   Tobacco Use  . Smoking status: Former Smoker    Types: Cigarettes  . Smokeless tobacco: Never Used  Substance Use Topics  . Alcohol use: No  . Drug use: No     Allergies   Patient has no known allergies.   Review of Systems Review of Systems  Constitutional: Negative for fever.  Gastrointestinal: Negative for abdominal pain.  Musculoskeletal: Positive for back pain.  Neurological: Positive for numbness. Negative for weakness.  All other systems reviewed and are negative.    Physical Exam Updated Vital Signs BP (!) 143/98 (BP Location: Right Arm)   Pulse 68   Temp 98.7 F (37.1 C) (Oral)   Resp 14   Ht 4\' 10"  (1.473 m)   Wt 77.1 kg   SpO2 99%   BMI 35.53 kg/m   Physical Exam Vitals signs and nursing note reviewed.  Constitutional:  General: She is not in acute distress.    Appearance: She is well-developed. She is not ill-appearing or diaphoretic.  HENT:     Head: Normocephalic and atraumatic.     Right Ear: External ear normal.     Left Ear: External ear normal.     Nose: Nose normal.  Eyes:     General:        Right eye: No discharge.        Left eye: No discharge.  Cardiovascular:     Rate and Rhythm: Normal rate and regular rhythm.     Heart sounds: Normal heart sounds.  Pulmonary:     Effort: Pulmonary effort is normal.     Breath sounds: Normal breath sounds.  Abdominal:     General: There is no distension.     Palpations: Abdomen is soft.     Tenderness: There is no abdominal tenderness.  Skin:    General: Skin  is warm and dry.  Neurological:     Mental Status: She is alert.     Comments: 5/5 strength in LLE. Limited strength testing in RLE due to severe pain it causes. Normal gross sensation in BLE and in hands.  Psychiatric:        Mood and Affect: Mood is not anxious.      ED Treatments / Results  Labs (all labs ordered are listed, but only abnormal results are displayed) Labs Reviewed  BASIC METABOLIC PANEL - Abnormal; Notable for the following components:      Result Value   Glucose, Bld 111 (*)    All other components within normal limits  CBC WITH DIFFERENTIAL/PLATELET - Abnormal; Notable for the following components:   WBC 3.6 (*)    Neutro Abs 1.4 (*)    All other components within normal limits    EKG None  Radiology Dg Lumbar Spine Complete  Result Date: 08/10/2018 CLINICAL DATA:  Acute low back pain radiating down LEFT leg for 1 day. No known injury. Initial encounter. EXAM: LUMBAR SPINE - COMPLETE 4+ VIEW COMPARISON:  07/26/2018 and prior studies FINDINGS: No acute fracture identified. 5 mm anterolisthesis of L4 on L5 and mild degenerative disc disease at this level again noted. No new subluxation noted. Mild facet arthropathy in the LOWER lumbar spine is present. No spondylolysis or suspicious bony lesions noted. Aortic atherosclerotic calcifications again noted. IMPRESSION: 1. No evidence of acute abnormality 2. Unchanged mild grade 1 anterolisthesis of L4 on L5 with mild degenerative disc disease at this level. 3.  Aortic Atherosclerosis (ICD10-I70.0). Electronically Signed   By: Harmon PierJeffrey  Hu M.D.   On: 08/10/2018 15:07    Procedures Procedures (including critical care time)  Medications Ordered in ED Medications  morphine 4 MG/ML injection 4 mg (4 mg Intravenous Given 08/10/18 1414)  sodium chloride 0.9 % bolus 500 mL (500 mLs Intravenous New Bag/Given 08/10/18 1413)     Initial Impression / Assessment and Plan / ED Course  I have reviewed the triage vital signs and  the nursing notes.  Pertinent labs & imaging results that were available during my care of the patient were reviewed by me and considered in my medical decision making (see chart for details).        Unclear cause of the patient's paresthesias in her fingertips.  However the labs are unremarkable.  Her pain is better with IV morphine and she is moving her right leg freer though still somewhat limited due to pain.  However now there seems  to be pain in her groin/hip so we will get x-ray of her hip.  After this she will need to prove she can walk.  Care transferred to Dr. Darl Householder.  Final Clinical Impressions(s) / ED Diagnoses   Final diagnoses:  None    ED Discharge Orders    None       Sherwood Gambler, MD 08/10/18 1539

## 2018-08-10 NOTE — ED Notes (Signed)
Pt walked well to the bathroom and back to the room

## 2019-10-30 ENCOUNTER — Emergency Department (HOSPITAL_COMMUNITY)
Admission: EM | Admit: 2019-10-30 | Discharge: 2019-10-30 | Disposition: A | Discharge status: Home / Self Care | Payer: Medicare Other

## 2021-01-15 ENCOUNTER — Emergency Department (HOSPITAL_COMMUNITY): Payer: Medicare Other

## 2021-01-15 ENCOUNTER — Inpatient Hospital Stay (HOSPITAL_COMMUNITY)
Admission: EM | Admit: 2021-01-15 | Discharge: 2021-01-18 | DRG: 175 | Disposition: A | Discharge status: Home / Self Care | Payer: Medicare Other | Attending: Internal Medicine | Admitting: Internal Medicine

## 2021-01-15 ENCOUNTER — Other Ambulatory Visit: Payer: Self-pay

## 2021-01-15 ENCOUNTER — Encounter (HOSPITAL_COMMUNITY): Payer: Self-pay | Admitting: Internal Medicine

## 2021-01-15 DIAGNOSIS — E876 Hypokalemia: Secondary | ICD-10-CM | POA: Diagnosis present

## 2021-01-15 DIAGNOSIS — I2699 Other pulmonary embolism without acute cor pulmonale: Secondary | ICD-10-CM | POA: Diagnosis present

## 2021-01-15 DIAGNOSIS — Z7902 Long term (current) use of antithrombotics/antiplatelets: Secondary | ICD-10-CM

## 2021-01-15 DIAGNOSIS — Z7983 Long term (current) use of bisphosphonates: Secondary | ICD-10-CM

## 2021-01-15 DIAGNOSIS — E119 Type 2 diabetes mellitus without complications: Secondary | ICD-10-CM | POA: Diagnosis present

## 2021-01-15 DIAGNOSIS — D649 Anemia, unspecified: Secondary | ICD-10-CM | POA: Diagnosis present

## 2021-01-15 DIAGNOSIS — Z79890 Hormone replacement therapy (postmenopausal): Secondary | ICD-10-CM

## 2021-01-15 DIAGNOSIS — Z87891 Personal history of nicotine dependence: Secondary | ICD-10-CM

## 2021-01-15 DIAGNOSIS — R29810 Facial weakness: Secondary | ICD-10-CM | POA: Diagnosis present

## 2021-01-15 DIAGNOSIS — R627 Adult failure to thrive: Secondary | ICD-10-CM | POA: Diagnosis present

## 2021-01-15 DIAGNOSIS — F0280 Dementia in other diseases classified elsewhere without behavioral disturbance: Secondary | ICD-10-CM | POA: Diagnosis not present

## 2021-01-15 DIAGNOSIS — I10 Essential (primary) hypertension: Secondary | ICD-10-CM | POA: Diagnosis present

## 2021-01-15 DIAGNOSIS — Z20822 Contact with and (suspected) exposure to covid-19: Secondary | ICD-10-CM | POA: Diagnosis present

## 2021-01-15 DIAGNOSIS — I82890 Acute embolism and thrombosis of other specified veins: Secondary | ICD-10-CM | POA: Diagnosis present

## 2021-01-15 DIAGNOSIS — G9340 Encephalopathy, unspecified: Secondary | ICD-10-CM | POA: Diagnosis present

## 2021-01-15 DIAGNOSIS — E039 Hypothyroidism, unspecified: Secondary | ICD-10-CM | POA: Diagnosis present

## 2021-01-15 DIAGNOSIS — G9341 Metabolic encephalopathy: Secondary | ICD-10-CM | POA: Diagnosis present

## 2021-01-15 DIAGNOSIS — G301 Alzheimer's disease with late onset: Secondary | ICD-10-CM

## 2021-01-15 DIAGNOSIS — R296 Repeated falls: Secondary | ICD-10-CM | POA: Diagnosis present

## 2021-01-15 DIAGNOSIS — E7800 Pure hypercholesterolemia, unspecified: Secondary | ICD-10-CM | POA: Diagnosis present

## 2021-01-15 DIAGNOSIS — E890 Postprocedural hypothyroidism: Secondary | ICD-10-CM | POA: Diagnosis present

## 2021-01-15 DIAGNOSIS — Z79899 Other long term (current) drug therapy: Secondary | ICD-10-CM

## 2021-01-15 DIAGNOSIS — Z6828 Body mass index (BMI) 28.0-28.9, adult: Secondary | ICD-10-CM

## 2021-01-15 DIAGNOSIS — R609 Edema, unspecified: Secondary | ICD-10-CM | POA: Diagnosis not present

## 2021-01-15 DIAGNOSIS — F0390 Unspecified dementia without behavioral disturbance: Secondary | ICD-10-CM | POA: Diagnosis present

## 2021-01-15 DIAGNOSIS — R5381 Other malaise: Secondary | ICD-10-CM | POA: Diagnosis not present

## 2021-01-15 DIAGNOSIS — R519 Headache, unspecified: Secondary | ICD-10-CM | POA: Diagnosis present

## 2021-01-15 DIAGNOSIS — Z833 Family history of diabetes mellitus: Secondary | ICD-10-CM

## 2021-01-15 DIAGNOSIS — Z90710 Acquired absence of both cervix and uterus: Secondary | ICD-10-CM

## 2021-01-15 DIAGNOSIS — R55 Syncope and collapse: Secondary | ICD-10-CM | POA: Diagnosis present

## 2021-01-15 DIAGNOSIS — R531 Weakness: Secondary | ICD-10-CM | POA: Diagnosis present

## 2021-01-15 DIAGNOSIS — G253 Myoclonus: Secondary | ICD-10-CM | POA: Diagnosis present

## 2021-01-15 HISTORY — DX: Unspecified dementia, unspecified severity, without behavioral disturbance, psychotic disturbance, mood disturbance, and anxiety: F03.90

## 2021-01-15 LAB — DIFFERENTIAL
Abs Immature Granulocytes: 0.02 10*3/uL (ref 0.00–0.07)
Basophils Absolute: 0 10*3/uL (ref 0.0–0.1)
Basophils Relative: 1 %
Eosinophils Absolute: 0 10*3/uL (ref 0.0–0.5)
Eosinophils Relative: 1 %
Immature Granulocytes: 1 %
Lymphocytes Relative: 42 %
Lymphs Abs: 1.9 10*3/uL (ref 0.7–4.0)
Monocytes Absolute: 0.5 10*3/uL (ref 0.1–1.0)
Monocytes Relative: 10 %
Neutro Abs: 2 10*3/uL (ref 1.7–7.7)
Neutrophils Relative %: 45 %

## 2021-01-15 LAB — COMPREHENSIVE METABOLIC PANEL
ALT: 17 U/L (ref 0–44)
AST: 27 U/L (ref 15–41)
Albumin: 3.3 g/dL — ABNORMAL LOW (ref 3.5–5.0)
Alkaline Phosphatase: 39 U/L (ref 38–126)
Anion gap: 5 (ref 5–15)
BUN: 8 mg/dL (ref 8–23)
CO2: 22 mmol/L (ref 22–32)
Calcium: 8.7 mg/dL — ABNORMAL LOW (ref 8.9–10.3)
Chloride: 108 mmol/L (ref 98–111)
Creatinine, Ser: 0.95 mg/dL (ref 0.44–1.00)
GFR, Estimated: >60 mL/min (ref >60)
Glucose, Bld: 109 mg/dL — ABNORMAL HIGH (ref 70–99)
Potassium: 3.4 mmol/L — ABNORMAL LOW (ref 3.5–5.1)
Sodium: 135 mmol/L (ref 135–145)
Total Bilirubin: 1 mg/dL (ref 0.3–1.2)
Total Protein: 5.6 g/dL — ABNORMAL LOW (ref 6.5–8.1)

## 2021-01-15 LAB — URINALYSIS, ROUTINE W REFLEX MICROSCOPIC
Bilirubin Urine: NEGATIVE
Glucose, UA: NEGATIVE mg/dL
Hgb urine dipstick: NEGATIVE
Ketones, ur: 5 mg/dL — AB
Leukocytes,Ua: NEGATIVE
Nitrite: NEGATIVE
Protein, ur: NEGATIVE mg/dL
Specific Gravity, Urine: 1.02 (ref 1.005–1.030)
pH: 7 (ref 5.0–8.0)

## 2021-01-15 LAB — CBC
HCT: 33.4 % — ABNORMAL LOW (ref 36.0–46.0)
Hemoglobin: 11 g/dL — ABNORMAL LOW (ref 12.0–15.0)
MCH: 29.1 pg (ref 26.0–34.0)
MCHC: 32.9 g/dL (ref 30.0–36.0)
MCV: 88.4 fL (ref 80.0–100.0)
Platelets: 189 10*3/uL (ref 150–400)
RBC: 3.78 MIL/uL — ABNORMAL LOW (ref 3.87–5.11)
RDW: 14.4 % (ref 11.5–15.5)
WBC: 4.4 10*3/uL (ref 4.0–10.5)
nRBC: 0 % (ref 0.0–0.2)

## 2021-01-15 LAB — CBG MONITORING, ED: Glucose-Capillary: 100 mg/dL — ABNORMAL HIGH (ref 70–99)

## 2021-01-15 LAB — I-STAT CHEM 8, ED
BUN: 7 mg/dL — ABNORMAL LOW (ref 8–23)
Calcium, Ion: 1.11 mmol/L — ABNORMAL LOW (ref 1.15–1.40)
Chloride: 106 mmol/L (ref 98–111)
Creatinine, Ser: 0.8 mg/dL (ref 0.44–1.00)
Glucose, Bld: 102 mg/dL — ABNORMAL HIGH (ref 70–99)
HCT: 33 % — ABNORMAL LOW (ref 36.0–46.0)
Hemoglobin: 11.2 g/dL — ABNORMAL LOW (ref 12.0–15.0)
Potassium: 3.4 mmol/L — ABNORMAL LOW (ref 3.5–5.1)
Sodium: 139 mmol/L (ref 135–145)
TCO2: 21 mmol/L — ABNORMAL LOW (ref 22–32)

## 2021-01-15 LAB — RESP PANEL BY RT-PCR (FLU A&B, COVID) ARPGX2
Influenza A by PCR: NEGATIVE
Influenza B by PCR: NEGATIVE
SARS Coronavirus 2 by RT PCR: NEGATIVE

## 2021-01-15 LAB — ETHANOL: Alcohol, Ethyl (B): <10 mg/dL (ref <10)

## 2021-01-15 LAB — RAPID URINE DRUG SCREEN, HOSP PERFORMED
Amphetamines: NOT DETECTED
Barbiturates: NOT DETECTED
Benzodiazepines: NOT DETECTED
Cocaine: NOT DETECTED
Opiates: POSITIVE — AB
Tetrahydrocannabinol: NOT DETECTED

## 2021-01-15 LAB — PROTIME-INR
INR: 1.3 — ABNORMAL HIGH (ref 0.8–1.2)
Prothrombin Time: 16.4 seconds — ABNORMAL HIGH (ref 11.4–15.2)

## 2021-01-15 LAB — APTT: aPTT: 28 seconds (ref 24–36)

## 2021-01-15 MED ORDER — LORAZEPAM 2 MG/ML IJ SOLN
1.0000 mg | Freq: Once | INTRAMUSCULAR | Status: AC
  Administered 2021-01-15: 1 mg via INTRAVENOUS
  Filled 2021-01-15: qty 1

## 2021-01-15 MED ORDER — ONDANSETRON HCL 4 MG/2ML IJ SOLN: 4.0000 mg | Freq: Four times a day (QID) | INTRAMUSCULAR | Status: DC | PRN

## 2021-01-15 MED ORDER — ACETAMINOPHEN 650 MG RE SUPP: 650.0000 mg | Freq: Four times a day (QID) | RECTAL | Status: DC | PRN

## 2021-01-15 MED ORDER — HEPARIN (PORCINE) 25000 UT/250ML-% IV SOLN
900.0000 [IU]/h | INTRAVENOUS | Status: DC
  Administered 2021-01-15: 19:00:00 1000 [IU]/h via INTRAVENOUS
  Administered 2021-01-16: 950 [IU]/h via INTRAVENOUS
  Administered 2021-01-17: 900 [IU]/h via INTRAVENOUS
  Filled 2021-01-15 (×3): qty 250

## 2021-01-15 MED ORDER — DONEPEZIL HCL 5 MG PO TABS
5.0000 mg | ORAL_TABLET | Freq: Every day | ORAL | Status: DC
  Administered 2021-01-15 – 2021-01-17 (×3): 5 mg via ORAL
  Filled 2021-01-15 (×4): qty 1

## 2021-01-15 MED ORDER — LEVOTHYROXINE SODIUM 75 MCG PO TABS
75.0000 ug | ORAL_TABLET | Freq: Every day | ORAL | Status: DC
  Administered 2021-01-16 – 2021-01-18 (×3): 75 ug via ORAL
  Filled 2021-01-15 (×3): qty 1

## 2021-01-15 MED ORDER — POTASSIUM CHLORIDE CRYS ER 20 MEQ PO TBCR
20.0000 meq | EXTENDED_RELEASE_TABLET | Freq: Once | ORAL | Status: AC
  Administered 2021-01-15: 20 meq via ORAL
  Filled 2021-01-15: qty 1

## 2021-01-15 MED ORDER — IOHEXOL 350 MG/ML SOLN
100.0000 mL | Freq: Once | INTRAVENOUS | Status: AC | PRN
  Administered 2021-01-15: 100 mL via INTRAVENOUS

## 2021-01-15 MED ORDER — ONDANSETRON HCL 4 MG PO TABS: 4.0000 mg | ORAL_TABLET | Freq: Four times a day (QID) | ORAL | Status: DC | PRN

## 2021-01-15 MED ORDER — ACETAMINOPHEN 325 MG PO TABS
650.0000 mg | ORAL_TABLET | Freq: Four times a day (QID) | ORAL | Status: DC | PRN
  Administered 2021-01-16 – 2021-01-18 (×6): 650 mg via ORAL
  Filled 2021-01-15 (×6): qty 2

## 2021-01-15 MED ORDER — ONDANSETRON HCL 4 MG/2ML IJ SOLN
4.0000 mg | Freq: Once | INTRAMUSCULAR | Status: AC
  Administered 2021-01-15: 4 mg via INTRAVENOUS
  Filled 2021-01-15: qty 2

## 2021-01-15 MED ORDER — VITAMIN D (ERGOCALCIFEROL) 1.25 MG (50000 UNIT) PO CAPS: 50000.0000 [IU] | ORAL_CAPSULE | ORAL | Status: DC

## 2021-01-15 MED ORDER — MORPHINE SULFATE (PF) 4 MG/ML IV SOLN
4.0000 mg | Freq: Once | INTRAVENOUS | Status: AC
  Administered 2021-01-15: 4 mg via INTRAVENOUS
  Filled 2021-01-15: qty 1

## 2021-01-15 MED ORDER — HEPARIN BOLUS VIA INFUSION
4100.0000 [IU] | Freq: Once | INTRAVENOUS | Status: AC
  Administered 2021-01-15: 4100 [IU] via INTRAVENOUS
  Filled 2021-01-15: qty 4100

## 2021-01-15 MED ORDER — SIMVASTATIN 20 MG PO TABS
40.0000 mg | ORAL_TABLET | Freq: Every day | ORAL | Status: DC
  Administered 2021-01-15 – 2021-01-17 (×3): 40 mg via ORAL
  Filled 2021-01-15 (×4): qty 2

## 2021-01-15 MED ORDER — VITAMIN B-12 100 MCG PO TABS
100.0000 ug | ORAL_TABLET | Freq: Every day | ORAL | Status: DC
  Administered 2021-01-16 – 2021-01-18 (×3): 100 ug via ORAL
  Filled 2021-01-15 (×3): qty 1

## 2021-01-15 NOTE — ED Notes (Signed)
Pt placed in hosp gown, adjusted in bed, sheets changed, warm blankets provided

## 2021-01-15 NOTE — ED Notes (Signed)
Pt requesting to call granddaughter. Number dialed for pt at this time. Pt is asking her to bring back her purse

## 2021-01-15 NOTE — ED Provider Notes (Addendum)
[facility] Provider Note   CSN: [id number] Arrival date & time: 01/15/21  1239     History  Chief Complaint  Patient presents with   Loss of Consciousness   Headache    April Carlson is a 71 y.o. female.  71 year old female presents today for evaluation of episode of loss of consciousness that occurred earlier today while patient was eating around 10:00 that lasted for about 15 to 20 minutes associated with generalized jerking, facial droop, altered mental status.  Since the episode patient has continued to have a facial droop, altered mental status, and generalized jerking.  She also reports a posterior headache.  Denies recent illnesses.  Patient does have recent history of recurrent falls including recent admission for concern of syncope and AKI.  Per granddaughter patient was doing well since discharge until this morning when she was confused even prior to this episode.  During the episode patient did not fall or suffer head trauma.  The history is provided by a relative and the patient. No language interpreter was used.  Loss of Consciousness Associated symptoms: headaches and weakness   Associated symptoms: no fever   Headache Associated symptoms: abdominal pain, syncope and weakness   Associated symptoms: no fever       Home Medications Prior to Admission medications   Medication Sig Start Date End Date Taking? Authorizing Provider  alendronate (FOSAMAX) 70 MG tablet Take 70 mg by mouth once a week. Take with a full glass of water on an empty stomach.    [provider]  clopidogrel (PLAVIX) 75 MG tablet Take 75 mg by mouth daily.    [provider]  Dulaglutide (TRULICITY [place]) Inject into the skin.    [provider]  gabapentin (NEURONTIN) 100 MG capsule Take 100 mg by mouth 3 (three) times daily.    [provider]  ibuprofen (ADVIL) 400 MG tablet Take 1 tablet (400 mg total) by mouth every 6  (six) hours as needed. 08/10/18   Charlynne PanderYao, David Hsienta, MD  levothyroxine (SYNTHROID, LEVOTHROID) 100 MCG tablet Take 125 mcg by mouth daily before breakfast.    [provider]  lisinopril (PRINIVIL,ZESTRIL) 10 MG tablet Take 10 mg by mouth daily.    [provider]  lisinopril-hydrochlorothiazide (PRINZIDE,ZESTORETIC) 10-12.5 MG tablet Take 1 tablet by mouth daily.    [provider]  methocarbamol (ROBAXIN) 500 MG tablet Take 1 tablet (500 mg total) by mouth every 6 (six) hours as needed for muscle spasms. 08/10/18   Charlynne PanderYao, David Hsienta, MD  oxyCODONE-acetaminophen (PERCOCET) 10-325 MG tablet Take 1 tablet by mouth every 4 (four) hours as needed for pain.    [provider]  polyethylene glycol powder (GLYCOLAX/MIRALAX) powder Take 17 g by mouth 2 (two) times daily. Until daily soft stools  OTC 02/22/15   Everlene Farrieransie, William, PA-C      Allergies    Patient has no known allergies.    Review of Systems   Review of Systems  Unable to perform ROS: Mental status change  Constitutional:  Negative for fever.  Cardiovascular:  Positive for syncope.  Gastrointestinal:  Positive for abdominal pain.  Neurological:  Positive for weakness and headaches.   Physical Exam Updated Vital Signs BP (!) 142/55    Pulse 64    Temp 98.4 F (36.9 C) (Oral)    Resp 20    Wt 62.8 kg Comment: weight minus wheelchair   SpO2 99%    BMI 28.94 kg/m  Physical Exam Vitals and nursing note reviewed.  Constitutional:      General: She is not in acute distress.    Appearance: Normal appearance. She is not ill-appearing.  HENT:     Head: Normocephalic and atraumatic.     Nose: Nose normal.  Eyes:     General: No scleral icterus.    Extraocular Movements: Extraocular movements intact.     Conjunctiva/sclera: Conjunctivae normal.  Cardiovascular:     Rate and Rhythm: Normal rate and regular rhythm.     Pulses: Normal pulses.     Heart sounds: Normal heart sounds.  Pulmonary:      Effort: Pulmonary effort is normal. No respiratory distress.     Breath sounds: Normal breath sounds. No wheezing.  Abdominal:     General: There is no distension.     Palpations: Abdomen is soft.     Tenderness: There is abdominal tenderness. There is guarding.  Musculoskeletal:        General: Normal range of motion.     Cervical back: Normal range of motion.     Right lower leg: No edema.     Left lower leg: No edema.  Skin:    General: Skin is warm and dry.  Neurological:     General: No focal deficit present.     Mental Status: She is alert. Mental status is at baseline.     Comments: Generalized weakness.  Facial droop present.  Myoclonic jerking noted on exam.    ED Results / Procedures / Treatments   Labs (all labs ordered are listed, but only abnormal results are displayed) Labs Reviewed  PROTIME-INR - Abnormal; Notable for the following components:      Result Value   Prothrombin Time 16.4 (*)    INR 1.3 (*)    All other components within normal limits  CBC - Abnormal; Notable for the following components:   RBC 3.78 (*)    Hemoglobin 11.0 (*)    HCT 33.4 (*)    All other components within normal limits  COMPREHENSIVE METABOLIC PANEL - Abnormal; Notable for the following components:   Potassium 3.4 (*)    Glucose, Bld 109 (*)    Calcium 8.7 (*)    Total Protein 5.6 (*)    Albumin 3.3 (*)    All other components within normal limits  I-STAT CHEM 8, ED - Abnormal; Notable for the following components:   Potassium 3.4 (*)    BUN 7 (*)    Glucose, Bld 102 (*)    Calcium, Ion 1.11 (*)    TCO2 21 (*)    Hemoglobin 11.2 (*)    HCT 33.0 (*)    All other components within normal limits  CBG MONITORING, ED - Abnormal; Notable for the following components:   Glucose-Capillary 100 (*)    All other components within normal limits  RESP PANEL BY RT-PCR (FLU A&B, COVID) [id number]  CULTURE, BLOOD (ROUTINE X 2)  CULTURE, BLOOD (ROUTINE X 2)  ETHANOL  APTT  DIFFERENTIAL   RAPID URINE DRUG SCREEN, HOSP PERFORMED  URINALYSIS, ROUTINE W REFLEX MICROSCOPIC  LACTIC ACID, PLASMA  LACTIC ACID, PLASMA    EKG EKG Interpretation  Date/Time:  Thursday January 15 2021 13:03:44 EST Ventricular Rate:  65 PR Interval:  146 QRS Duration: 74 QT Interval:  406 QTC Calculation: 422 R Axis:   -10 Text Interpretation: Normal sinus rhythm Low voltage QRS Possible Anterolateral infarct , age undetermined Abnormal ECG When compared with ECG of  19-Dec-2008 04:50, PREVIOUS ECG IS PRESENT No significant change since last tracing Confirmed by Jacalyn Lefevre [phone]) on 01/15/2021 2:13:05 PM  Radiology CT HEAD CODE STROKE WO CONTRAST  Result Date: 01/15/2021 CLINICAL DATA:  Code stroke. EXAM: CT HEAD WITHOUT CONTRAST TECHNIQUE: Contiguous axial images were obtained from the base of the skull through the vertex without intravenous contrast. COMPARISON:  Brain MRI 02/23/2011, CT head 01/08/2021 FINDINGS: Brain: There is no evidence of acute intracranial hemorrhage, extra-axial fluid collection, or acute infarct. Mild global parenchymal volume loss with slightly disproportionate hippocampal atrophy is unchanged. The ventricles are stable in size. There is no mass lesion. There is no midline shift. Vascular: There is calcification of the bilateral cavernous ICAs. No dense vessel is seen. Skull: Normal. Negative for fracture or focal lesion. Sinuses/Orbits: The imaged paranasal sinuses are clear. The globes and orbits are unremarkable. Other: None. ASPECTS [facility] Stroke Program Early CT Score) - Ganglionic level infarction (caudate, lentiform nuclei, internal capsule, insula, M1-M3 cortex): 7 - Supraganglionic infarction (M4-M6 cortex): 3 Total score (0-10 with 10 being normal): 10 IMPRESSION: 1. No acute intracranial pathology. No significant interval change since 01/08/2021. 2. ASPECTS is 10 These results were paged via amion at the time of interpretation on 01/15/2021 at 1:31 pm to provider Dr  Selina Cooley. Electronically Signed   By: Lesia Hausen M.D.   On: 01/15/2021 13:33    Procedures Procedures    Medications Ordered in ED Medications - No data to display  ED Course/ Medical Decision Making/ A&P                           Medical Decision Making 71 year old female presents today for evaluation of altered mental status, facial droop, loss of consciousness, generalized jerking.  Code stroke was activated in triage and patient has been evaluated by neurology.  Neurology recommends metabolic work-up.  Patient's initial work-up significant for CBC without leukocytosis, hemoglobin of 11.0 which is around patient's baseline, CMP mild hypokalemia at 3.4, creatinine is 0.95, glucose of 100.  CT head without acute intracranial findings.  EKG without acute ischemic changes.  CT abdomen pelvis with contrast, CT angio chest PE study, portable chest x-ray ordered for evaluation of significant abdominal tenderness on exam, complaint of shortness of breath earlier today.    This patient presents to the ED for concern of altered mental status, loss of consciousness, generalized jerking and generalized weakness, facial droop this involves an extensive number of treatment options, and is a complaint that carries with it a high risk of complications and morbidity.  The differential diagnosis includes CVA, metabolic encephalopathy, syncope   Co morbidities that complicate the patient evaluation  Dementia, diabetes   Additional history obtained:  Additional history obtained from family members at bedside External records from outside source obtained and reviewed including recent admission from where she was discharged on 12/29 for syncopal work-up and AKI   Imaging Studies ordered:  I ordered imaging studies including CT head, CT abdomen pelvis with contrast, CTA chest PE study, chest x-ray I independently visualized and interpreted imaging which showed CT head without acute intracranial findings.   CT abdomen pelvis with contrast, CTA chest PE study, chest x-ray still pending at the end of my shift. I agree with the radiologist interpretation   Cardiac Monitoring:  The patient was maintained on a cardiac monitor.  I personally viewed and interpreted the cardiac monitored which showed an underlying rhythm of: Normal sinus rhythm.  Medicines ordered and prescription drug management:  I ordered medication including Ativan, morphine, Zofran for headache, and sedation to obtain CT imaging. Reevaluation of the patient after these medicines showed that the patient stayed the same I have reviewed the patients home medicines and have made adjustments as needed   Consultations Obtained:  I requested consultation with the neurology,  and discussed lab and imaging findings as well as pertinent plan - they recommend: No further stroke work-up.  They recommend further metabolic work-up for acute encephalopathy.  Please see their note for full assessment and recommendations.   Problem List / ED Course:  Altered mental status, generalized weakness, generalized jerking, facial droop, abdominal pain, headache, loss of consciousness   Reevaluation:  After the interventions noted above, I reevaluated the patient and found that they have :stayed the same  Dispostion:  At the end of my shift patient is awaiting imaging studies.  Patient signed out to oncoming provider for follow-up on imaging and appropriate disposition.  Patient will likely benefit from admission for further work-up of acute encephalopathy.    Final Clinical Impression(s) / ED Diagnoses Final diagnoses:  Encephalopathy    Rx / DC Orders ED Discharge Orders     None         Marita Kansasli, Jeanell Mangan, PA-C 01/15/21 1544    Marita Kansasli, Luiza Carranco, PA-C 01/15/21 1548    Jacalyn LefevreHaviland, Julie, MD 01/16/21 Rickey [id number]

## 2021-01-15 NOTE — ED Notes (Signed)
Received verbal report from Catherine W. RN at this time 

## 2021-01-15 NOTE — Consult Note (Signed)
Neurology Consultation  Reason for Consult: Code Stroke Referring Physician: Dr. Particia Nearing  CC: "My head hurts"  History is obtained from: Patient's daughter at bedside, unable to obtain from patient due to patient AMS, Chart review  HPI: April Carlson is a 71 y.o. female with a medical history significant for type 2 diabetes mellitus, chronic kidney disease stage 2, hyperlipidemia, essential hypertension, hypothyroidism, dementia, and a recent hospitalization from 01/08/21 - 01/13/21 at [facility] location after a fall at home striking her head, an AKI, and hypokalemia. Patient's family reports that she had two falls in December once striking her forehead on a sharp corner of the door and the second incident on 12/28 where she struck the back of her head against a door knob before falling forward and striking her head on the ground. Since discharge on 1/3, patient reportedly has not had any PO intake in the past 3 days. Today, she was out at a restaurant eating breakfast with her family when she choked and then became unresponsive and slumped over onto her sister's shoulder for approximately 15 minutes until EMS arrived. While in triage, there was concern for right-sided weakness and a Code Stroke was activated for neurology evaluation and management.   LKW: 10:00 TNK given?: no, patient is without focality on neurologic examination  IR Thrombectomy? No, presentation is not consistent with an LVO Modified Rankin Scale: 3-Moderate disability-requires help but walks WITHOUT assistance  ROS: A complete ROS was performed and is negative except as noted in the HPI.   Past Medical History:  Diagnosis Date   Diabetes mellitus without complication (HCC)    High cholesterol    Hypertension    Thyroid disease    Past Surgical History:  Procedure Laterality Date   ABDOMINAL HYSTERECTOMY     No family history on file.  Social History:   reports that she has quit smoking. Her smoking use  included cigarettes. She has never used smokeless tobacco. She reports that she does not drink alcohol and does not use drugs.  Medications No current facility-administered medications for this encounter.  Current Outpatient Medications:    alendronate (FOSAMAX) 70 MG tablet, Take 70 mg by mouth once a week. Take with a full glass of water on an empty stomach., Disp: , Rfl:    clopidogrel (PLAVIX) 75 MG tablet, Take 75 mg by mouth daily., Disp: , Rfl:    Dulaglutide (TRULICITY Ramos), Inject into the skin., Disp: , Rfl:    gabapentin (NEURONTIN) 100 MG capsule, Take 100 mg by mouth 3 (three) times daily., Disp: , Rfl:    ibuprofen (ADVIL) 400 MG tablet, Take 1 tablet (400 mg total) by mouth every 6 (six) hours as needed., Disp: 30 tablet, Rfl: 0   levothyroxine (SYNTHROID, LEVOTHROID) 100 MCG tablet, Take 125 mcg by mouth daily before breakfast., Disp: , Rfl:    lisinopril (PRINIVIL,ZESTRIL) 10 MG tablet, Take 10 mg by mouth daily., Disp: , Rfl:    lisinopril-hydrochlorothiazide (PRINZIDE,ZESTORETIC) 10-12.5 MG tablet, Take 1 tablet by mouth daily., Disp: , Rfl:    methocarbamol (ROBAXIN) 500 MG tablet, Take 1 tablet (500 mg total) by mouth every 6 (six) hours as needed for muscle spasms., Disp: 10 tablet, Rfl: 0   oxyCODONE-acetaminophen (PERCOCET) 10-325 MG tablet, Take 1 tablet by mouth every 4 (four) hours as needed for pain., Disp: , Rfl:    polyethylene glycol powder (GLYCOLAX/MIRALAX) powder, Take 17 g by mouth 2 (two) times daily. Until daily soft stools  OTC, Disp: 250  g, Rfl: 1  Exam: Current vital signs: BP (!) 136/96 (BP Location: Left Arm)    Pulse 69    Temp 98.4 F (36.9 C) (Oral)    Resp 14    Wt 62.8 kg Comment: weight minus wheelchair   SpO2 100%    BMI 28.94 kg/m  Vital signs in last 24 hours: Temp:  [98.4 F (36.9 C)] 98.4 F (36.9 C) (01/05 1301) Pulse Rate:  [69] 69 (01/05 1301) Resp:  [14] 14 (01/05 1301) BP: (136)/(96) 136/96 (01/05 1301) SpO2:  [100 %] 100 % (01/05  1301) Weight:  [62.8 kg] 62.8 kg (01/05 1301)  GENERAL: Awake, alert, in no acute distress Psych: Patient is intermittently restless and anxious with examination  Head: Normocephalic and atraumatic, without obvious abnormality EENT: Normal conjunctivae, dry mucous membranes, no OP obstruction LUNGS: Normal respiratory effort. Non-labored breathing on room air CV: Regular rate and rhythm on telemetry ABDOMEN: Soft, non-tender, non-distended Extremities: warm, well perfused, without obvious deformity  NEURO:  Mental Status: Awake, alert, and oriented to self only. She states "I don't know" when asked her age, the month, the year, and the place. She does not recall the events leading up to hospitalization.  Speech/Language: speech is intact without dysarthria.    Poor attention is noted. Patient is able to name objects with minimal surrounding distraction and she follows simple commands intermittently.  No neglect is noted Cranial Nerves:  II: PERRL 3 mm/brisk. Visual fields full.  III, IV, VI: EOMI without ptosis, nystagmus, or gaze preference V: Sensation is intact to light touch and symmetrical to face.  VII: Face is symmetric resting and smiling with questionable intermittent right mouth droop.  VIII: Hearing is intact to voice IX, X: Palate elevation is symmetric. Phonation normal.  XI: Normal sternocleidomastoid and trapezius muscle strength XII: Tongue protrudes midline without fasciculations.   Motor: Patient is able to elevate each extremity antigravity but due to poor concentration, does not continue to hold each extremity against gravity. There is no unilateral or focal weakness noted throughout examination. Patient does have limited participation.  Tone is normal. Bulk is normal.  Sensation: Intact to light touch bilaterally in all four extremities. No extinction to DSS present.  Coordination: Unable to perform  Gait: Deferred for patient's safety with multiple recent falls    NIHSS: 1a Level of Conscious.: 0 1b LOC Questions: 2 1c LOC Commands: 1 2 Best Gaze: 0 3 Visual: 0 4 Facial Palsy: 0 5a Motor Arm - left: 1 5b Motor Arm - Right: 1 6a Motor Leg - Left: 2 6b Motor Leg - Right: 2 7 Limb Ataxia: 0 8 Sensory: 0 9 Best Language: 0 10 Dysarthria: 0 11 Extinct. and Inatten.: 0 TOTAL: 9  Labs I have reviewed labs in epic and the results pertinent to this consultation are: CBC    Component Value Date/Time   WBC 4.4 01/15/2021 1312   RBC 3.78 (L) 01/15/2021 1312   HGB 11.2 (L) 01/15/2021 1318   HCT 33.0 (L) 01/15/2021 1318   PLT 189 01/15/2021 1312   MCV 88.4 01/15/2021 1312   MCH 29.1 01/15/2021 1312   MCHC 32.9 01/15/2021 1312   RDW 14.4 01/15/2021 1312   LYMPHSABS 1.9 01/15/2021 1312   MONOABS 0.5 01/15/2021 1312   EOSABS 0.0 01/15/2021 1312   BASOSABS 0.0 01/15/2021 1312   CMP     Component Value Date/Time   NA 139 01/15/2021 1318   K 3.4 (L) 01/15/2021 1318   CL 106 01/15/2021  1318   CO2 22 01/15/2021 1312   GLUCOSE 102 (H) 01/15/2021 1318   BUN 7 (L) 01/15/2021 1318   CREATININE 0.80 01/15/2021 1318   CALCIUM 8.7 (L) 01/15/2021 1312   PROT 5.6 (L) 01/15/2021 1312   ALBUMIN 3.3 (L) 01/15/2021 1312   AST 27 01/15/2021 1312   ALT 17 01/15/2021 1312   ALKPHOS 39 01/15/2021 1312   BILITOT 1.0 01/15/2021 1312   GFRNONAA >60 01/15/2021 1312   GFRAA >60 08/10/2018 1405   Lipid Panel  No results found for: CHOL, TRIG, HDL, CHOLHDL, VLDL, LDLCALC, LDLDIRECT No results found for: HGBA1C  Imaging I have reviewed the images obtained:  CT-scan of the brain 1/5: 1. No acute intracranial pathology. No significant interval change since 01/08/2021. 2. ASPECTS is 10  Assessment: 71 y.o. female with PMHx as above who presented to the ED for evaluation after choking on her breakfast this morning followed by a 15 minute episode of unresponsiveness. On arrival in the ED, she was evaluated in triage with concern for possible right-sided  weakness and a Code Stroke was activated.  - Examination reveals patient without focal weakness, disoriented to place, situation, and time, and with ongoing complaints of posterior head pain. Family does note that the patient has had poor PO intake over the past 3 days. Her initial NIHSS is 9. - CT head without contrast was obtained without acute intracranial pathology and without significant change from imaging obtained on 01/08/2021.  - Presentation is concerning for acute encephalopathy- possibly metabolic or nutritional in etiology with poor PO intake over the past 3 days and a recent history of an AKI with discharge from inpatient hospitalization on 1/3.   Recommendations: - Metabolic work up per EDP / primary team  Pt seen by NP/Neuro and later by MD. Note/plan to be edited by MD as needed.  Lanae Boast, AGAC-NP [facility] Pager: [phone]  Neurology Attending Attestation   I examined the patient and discussed plan with Ms. Toberman NP. Above note has been edited by me to reflect my findings and recommendations. I was present throughout the stroke code and made all significant decisions and personally reviewed CNS imaging and also discussed CTA results with radiologist by phone. Patient has no focal findings to suggest acute infarct. Recommend workup for metabolic/infectious etiologies per ED/admitting team. Neurology to sign off, but please re-engage if additional neurologic concerns arise.   Bing Neighbors, MD [facility] [phone]   If 7pm- 7am, please page neurology on call as listed in AMION.

## 2021-01-15 NOTE — Progress Notes (Signed)
ANTICOAGULATION CONSULT NOTE - Initial Consult  Pharmacy Consult for IV heparin Indication: pulmonary embolus  No Known Allergies  Patient Measurements: Weight: 62.8 kg (138 lb 7.2 oz) (weight minus wheelchair) Heparin Dosing Weight: 58.6 kg  Vital Signs: Temp: 98.4 F (36.9 C) (01/05 1301) Temp Source: Oral (01/05 1301) BP: 142/60 (01/05 1800) Pulse Rate: 69 (01/05 1800)  Labs: Recent Labs    01/15/21 1312 01/15/21 1318  HGB 11.0* 11.2*  HCT 33.4* 33.0*  PLT 189  --   APTT 28  --   LABPROT 16.4*  --   INR 1.3*  --   CREATININE 0.95 0.80    CrCl cannot be calculated (Unknown ideal weight.).   Medical History: Past Medical History:  Diagnosis Date   Diabetes mellitus without complication (HCC)    High cholesterol    Hypertension    Thyroid disease     Assessment: 37 YOF presenting to ED with loss of consciousness. CT positive for acute bilateral peripheral PE. No history of anticoagulation PTA. Pharmacy to dose IV heparin.  Hgb slightly decreased, PLTc within normal limits. Scr is at baseline.   Goal of Therapy:  Heparin level 0.3-0.7 units/ml Monitor platelets by anticoagulation protocol: Yes   Plan:  Heparin IV 4100 units bolus x 1 Start IV heparin gtt at 1000 units/h 8 hour heparin level check Daily heparin level, CBC Monitor for signs and symptoms of bleeding Follow up anticoagulation plans  Thank you for involving pharmacy in this patient's care.  Arnette Felts, PharmD PGY1 Ambulatory Care Pharmacy Resident 01/15/2021 6:51 PM  **Pharmacist phone directory can be found on [email or web address] listed under [facility]**

## 2021-01-15 NOTE — ED Provider Notes (Signed)
°  Physical Exam  BP 124/64    Pulse 68    Temp 98.4 F (36.9 C) (Oral)    Resp 17    Wt 62.8 kg Comment: weight minus wheelchair   SpO2 100%    BMI 28.94 kg/m   Physical Exam  Procedures  Procedures  ED Course / MDM   Clinical Course as of 01/16/21 0047  Thu Jan 15, 2021  1549 Not anticoagulated on ASA or plavix.  [RS]  1812 Consult call from radiologist, Dr. Rica Records, with critical result.  On PE study this patient has bilateral peripheral pulmonary emboli, as well as clot in the right ovarian vein which could be the etiology of patient's PE.  Appreciate his collaboration care of this patient.  [RS]  1942 Consult to hospitalist, Dr. Julian Reil, who is agreeable to seeing the patient and admitting her to his service.  Appreciate his collaboration in the care of this patient. [RS]    Clinical Course User Index [RS] Serafina Topham, Eugene Gavia, PA-C   Medical Decision Making Care of this patient assumed from preceding ED provider A. Karie Mainland, PA-C at time of shift change.  Please see his associated note for further insight into the patient's ED course.  In brief patient is a 71 year old female with history of diabetes, hypertension, and hypothyroidism who presented to the emergency department as a code stroke due to concern for sudden onset left-sided facial droop, loss of consciousness, questionable seizure, and subsequent altered mental status onset at 10 AM.  Patient was evaluated by neurology who feel this is more likely a metabolic etiology rather than stroke.  No indication for MRI per neurology at this time; additionally according to present ED provider given patient's degree of alteration in her mental status she would likely not tolerate an MRI at this time as she required Ativan administration prior to CT.  Patient with continued intermittent left upper extremity myoclonus and facial droop.  At time of shift change is awaiting CT of the abdomen pelvis as well as PE study to evaluate for abdominal  pain and generalized tenderness to palpation.  Management pending CT result and ultimately will require admission to the hospital for altered mental status and ongoing neurologic presentation.  CBC without leukocytosis and with mild anemia with hemoglobin of 11 without recent prior to compare.  CMP with mild hypokalemia of 3.4.  INR mildly elevated 1.3.  Ethanol is negative.  CT PE positive for peripheral clots.  CT abdomen pelvis with right paraspinous versus ovary clot.  Heparin ordered.  Consult to hospitalist as above ,patient admitting to his service.  Family at the bedside with multiple questions, each of which was answered to their expressed satisfaction. They are amenable for admission at this time.  Patient remains hemodynamically stable and remains disoriented at her baseline confusion for the evenings, according to family in context of her known advanced dementia.  This chart was dictated using voice recognition software, Dragon. Despite the best efforts of this provider to proofread and correct errors, errors may still occur which can change documentation meaning.       Paris Lore, PA-C 01/16/21 0049    Franne Forts, DO 01/17/21 1929

## 2021-01-15 NOTE — H&P (Signed)
History and Physical    Heavin Sebree ZOX:[id number] DOB: January 03, 1951 DOA: 01/15/2021  PCP: Redmond School, NP  Patient coming from: Home  I have personally briefly reviewed patient's old medical records in [facility]  Chief Complaint: Syncope  HPI: April Carlson is a 71 y.o. female with medical history significant of DM2, HTN, HLD, dementia.  Pt with recent admission to [facility] 12/29-1/3 for fall, found to have AKI, improved with holding lisinopril and diuretics.  Felt to be related to recent poor PO intake.  Today pt with episode of syncope ~1000 while eating.  Lasted ~15-20 mins.  Associated with generalized jerking, followed by facial droop, AMS.  Has continued to have myoclonic jerking since after episode.  Also posterior headache.  No fall nor traumatic injury during witnessed episode.   ED Course: Initially a code stroke, neurology felt stroke unlikely.  CT head neg.  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   Review of Systems: As per HPI, otherwise all review of systems negative.  Past Medical History:  Diagnosis Date   Dementia (HCC)    Diabetes mellitus without complication (HCC)    High cholesterol    Hypertension    Thyroid disease     Past Surgical History:  Procedure Laterality Date   ABDOMINAL HYSTERECTOMY     THYROIDECTOMY       reports that she has quit smoking. Her smoking use included cigarettes. She has never used smokeless tobacco. She reports that she does not drink alcohol and does not use drugs.  No Known Allergies  Family History  Problem Relation Age of Onset   Cancer Mother    Diabetes Mother    Cancer Father    Cancer Sister    Diabetes Sister    Dementia Sister      Prior to Admission medications   Medication Sig Start Date End Date Taking? Authorizing Provider  alendronate (FOSAMAX) 70 MG  tablet Take 70 mg by mouth once a week. Take with a full glass of water on an empty stomach.    [provider]  clopidogrel (PLAVIX) 75 MG tablet Take 75 mg by mouth daily.    [provider]  Dulaglutide (TRULICITY [place]) Inject into the skin.    [provider]  gabapentin (NEURONTIN) 100 MG capsule Take 100 mg by mouth 3 (three) times daily.    [provider]  ibuprofen (ADVIL) 400 MG tablet Take 1 tablet (400 mg total) by mouth every 6 (six) hours as needed. 08/10/18   Charlynne Pander, MD  levothyroxine (SYNTHROID, LEVOTHROID) 100 MCG tablet Take 125 mcg by mouth daily before breakfast.    [provider]  lisinopril (PRINIVIL,ZESTRIL) 10 MG tablet Take 10 mg by mouth daily.    [provider]  lisinopril-hydrochlorothiazide (PRINZIDE,ZESTORETIC) 10-12.5 MG tablet Take 1 tablet by mouth daily.    [provider]  methocarbamol (ROBAXIN) 500 MG tablet Take 1 tablet (500 mg total) by mouth every 6 (six) hours as needed for muscle spasms. 08/10/18   Charlynne Pander, MD  oxyCODONE-acetaminophen (PERCOCET) 10-325 MG tablet Take 1 tablet by mouth every 4 (four) hours as needed for pain.    [provider]  polyethylene glycol powder (GLYCOLAX/MIRALAX) powder Take 17 g by mouth 2 (two) times daily. Until daily soft stools  OTC 02/22/15   Everlene Farrier, PA-C  Physical Exam: Vitals:   01/15/21 1700 01/15/21 1800 01/15/21 1830 01/15/21 1900  BP: (!) 134/99 (!) 142/60 131/64 (!) 121/50  Pulse: 77 69 65 66  Resp: 17 18 14 11   Temp:      TempSrc:      SpO2: 100% 98% 99% 100%  Weight:        Constitutional: NAD, calm, comfortable Eyes: PERRL, lids and conjunctivae normal ENMT: Mucous membranes are moist. Posterior pharynx clear of any exudate or lesions.Normal dentition.  Neck: normal, supple, no masses, no thyromegaly Respiratory: clear to auscultation bilaterally, no wheezing, no crackles. Normal respiratory effort.  No accessory muscle use.  Cardiovascular: Regular rate and rhythm, no murmurs / rubs / gallops. No extremity edema. 2+ pedal pulses. No carotid bruits.  Abdomen: TTP with guarding Musculoskeletal: no clubbing / cyanosis. No joint deformity upper and lower extremities. Good ROM, no contractures. Normal muscle tone.  Skin: no rashes, lesions, ulcers. No induration Neurologic: Generalized weakness, BUE myoclonic jerking. Psychiatric: Demented, not oriented.   Labs on Admission: I have personally reviewed following labs and imaging studies  CBC: Recent Labs  Lab 01/15/21 1312 01/15/21 1318  WBC 4.4  --   NEUTROABS 2.0  --   HGB 11.0* 11.2*  HCT 33.4* 33.0*  MCV 88.4  --   PLT 189  --    Basic Metabolic Panel: Recent Labs  Lab 01/15/21 1312 01/15/21 1318  NA 135 139  K 3.4* 3.4*  CL 108 106  CO2 22  --   GLUCOSE 109* 102*  BUN 8 7*  CREATININE 0.95 0.80  CALCIUM 8.7*  --    GFR: CrCl cannot be calculated (Unknown ideal weight.). Liver Function Tests: Recent Labs  Lab 01/15/21 1312  AST 27  ALT 17  ALKPHOS 39  BILITOT 1.0  PROT 5.6*  ALBUMIN 3.3*   No results for input(s): LIPASE, AMYLASE in the last 168 hours. No results for input(s): AMMONIA in the last 168 hours. Coagulation Profile: Recent Labs  Lab 01/15/21 1312  INR 1.3*   Cardiac Enzymes: No results for input(s): CKTOTAL, CKMB, CKMBINDEX, TROPONINI in the last 168 hours. BNP (last 3 results) No results for input(s): PROBNP in the last 8760 hours. HbA1C: No results for input(s): HGBA1C in the last 72 hours. CBG: Recent Labs  Lab 01/15/21 1308  GLUCAP 100*   Lipid Profile: No results for input(s): CHOL, HDL, LDLCALC, TRIG, CHOLHDL, LDLDIRECT in the last 72 hours. Thyroid Function Tests: No results for input(s): TSH, T4TOTAL, FREET4, T3FREE, THYROIDAB in the last 72 hours. Anemia Panel: No results for input(s): VITAMINB12, FOLATE, FERRITIN, TIBC, IRON, RETICCTPCT in the last 72 hours. Urine  analysis:    Component Value Date/Time   COLORURINE YELLOW 02/22/2015 1010   APPEARANCEUR CLEAR 02/22/2015 1010   LABSPEC 1.012 02/22/2015 1010   PHURINE 7.0 02/22/2015 1010   GLUCOSEU NEGATIVE 02/22/2015 1010   HGBUR NEGATIVE 02/22/2015 1010   BILIRUBINUR NEGATIVE 02/22/2015 1010   KETONESUR NEGATIVE 02/22/2015 1010   PROTEINUR NEGATIVE 02/22/2015 1010   UROBILINOGEN 1.0 12/19/2008 0621   NITRITE NEGATIVE 02/22/2015 1010   LEUKOCYTESUR NEGATIVE 02/22/2015 1010    Radiological Exams on Admission: CT Angio Chest PE W and/or Wo Contrast  Result Date: 01/15/2021 CLINICAL DATA:  Syncope, abdominal pain, shortness of breath EXAM: CT ANGIOGRAPHY CHEST CT ABDOMEN AND PELVIS WITH CONTRAST TECHNIQUE: Multidetector CT imaging of the chest was performed using the standard protocol during bolus administration of intravenous contrast. Multiplanar CT image reconstructions and MIPs were obtained  to evaluate the vascular anatomy. Multidetector CT imaging of the abdomen and pelvis was performed using the standard protocol during bolus administration of intravenous contrast. CONTRAST:  100mL OMNIPAQUE IOHEXOL 350 MG/ML SOLN COMPARISON:  02/22/2015 FINDINGS: CTA CHEST FINDINGS Cardiovascular: Heart size normal. No pericardial effusion. Mild RV dilatation. Good contrast opacification of pulmonary arteries. Scattered incompletely occlusive pulmonary emboli extending to segmental branches in the lingula, left lower lobe, and right upper lobe. No significant central or saddle PE. Scattered coronary calcifications. Good contrast opacification of the thoracic aorta, with no aneurysm, dissection, or stenosis. Calcified plaque in the distal arch at the left subclavian artery origin without high grade stenosis. Scattered calcified plaque in the descending segment. Mediastinum/Nodes: No mass or adenopathy. Lungs/Pleura: No pleural effusion. Mild dependent atelectasis posteriorly in the lower lobes. No evidence of pulmonary  infarct. Musculoskeletal: Mild degenerative changes in the thoracic spine. No fracture or worrisome bone lesion. Review of the MIP images confirms the above findings. CT ABDOMEN and PELVIS FINDINGS Hepatobiliary: Stable subcentimeter cyst in hepatic segment 8. No new liver lesion or biliary ductal dilatation. No calcified gallstones. Gallbladder nondistended. Pancreas: Unremarkable. No pancreatic ductal dilatation or surrounding inflammatory changes. Spleen: Normal in size without focal abnormality. Adrenals/Urinary Tract: No adrenal mass. 2 small right renal small lesions, possibly cysts. No urolithiasis,. No hydronephrosis. Urinary bladder physiologically distended. Stomach/Bowel: Stomach is incompletely distended, unremarkable. Small bowel is nondilated. Appendix not discretely identified. The colon is nondilated, unremarkable. Vascular/Lymphatic: Moderate calcified aortoiliac atheromatous plaque without dissection, aneurysm, or stenosis. Left-sided infrarenal IVC, an anatomic variant. There is acute appearing incompletely occlusive thrombus in a right paraspinal or ovarian draining into the juxtarenal IVC. Reproductive: Status post hysterectomy. No adnexal masses. Other: No ascites.  No free air. Musculoskeletal: Facet DJD L4-5 may account for the mild grade 1 anterolisthesis. No fracture or worrisome bone lesion. Bilateral hip DJD. Review of the MIP images confirms the above findings. IMPRESSION: 1. Positive for acute bilateral peripheral pulmonary emboli. 2. Acute thrombus right paraspinal or ovarian vein draining into the juxtarenal IVC. Critical Value/emergent results were called by telephone at the time of interpretation on 01/15/2021 at 6:13 pm to provider Paris Loreebekah R Sponseller, who verbally acknowledged these results. 3. Coronary and Aortic Atherosclerosis (ICD10-170.0). Electronically Signed   By: Corlis Leak  Hassell M.D.   On: 01/15/2021 18:18   CT ABDOMEN PELVIS W CONTRAST  Result Date: 01/15/2021 CLINICAL  DATA:  Syncope, abdominal pain, shortness of breath EXAM: CT ANGIOGRAPHY CHEST CT ABDOMEN AND PELVIS WITH CONTRAST TECHNIQUE: Multidetector CT imaging of the chest was performed using the standard protocol during bolus administration of intravenous contrast. Multiplanar CT image reconstructions and MIPs were obtained to evaluate the vascular anatomy. Multidetector CT imaging of the abdomen and pelvis was performed using the standard protocol during bolus administration of intravenous contrast. CONTRAST:  100mL OMNIPAQUE IOHEXOL 350 MG/ML SOLN COMPARISON:  02/22/2015 FINDINGS: CTA CHEST FINDINGS Cardiovascular: Heart size normal. No pericardial effusion. Mild RV dilatation. Good contrast opacification of pulmonary arteries. Scattered incompletely occlusive pulmonary emboli extending to segmental branches in the lingula, left lower lobe, and right upper lobe. No significant central or saddle PE. Scattered coronary calcifications. Good contrast opacification of the thoracic aorta, with no aneurysm, dissection, or stenosis. Calcified plaque in the distal arch at the left subclavian artery origin without high grade stenosis. Scattered calcified plaque in the descending segment. Mediastinum/Nodes: No mass or adenopathy. Lungs/Pleura: No pleural effusion. Mild dependent atelectasis posteriorly in the lower lobes. No evidence of pulmonary infarct. Musculoskeletal: Mild degenerative  changes in the thoracic spine. No fracture or worrisome bone lesion. Review of the MIP images confirms the above findings. CT ABDOMEN and PELVIS FINDINGS Hepatobiliary: Stable subcentimeter cyst in hepatic segment 8. No new liver lesion or biliary ductal dilatation. No calcified gallstones. Gallbladder nondistended. Pancreas: Unremarkable. No pancreatic ductal dilatation or surrounding inflammatory changes. Spleen: Normal in size without focal abnormality. Adrenals/Urinary Tract: No adrenal mass. 2 small right renal small lesions, possibly cysts.  No urolithiasis,. No hydronephrosis. Urinary bladder physiologically distended. Stomach/Bowel: Stomach is incompletely distended, unremarkable. Small bowel is nondilated. Appendix not discretely identified. The colon is nondilated, unremarkable. Vascular/Lymphatic: Moderate calcified aortoiliac atheromatous plaque without dissection, aneurysm, or stenosis. Left-sided infrarenal IVC, an anatomic variant. There is acute appearing incompletely occlusive thrombus in a right paraspinal or ovarian draining into the juxtarenal IVC. Reproductive: Status post hysterectomy. No adnexal masses. Other: No ascites.  No free air. Musculoskeletal: Facet DJD L4-5 may account for the mild grade 1 anterolisthesis. No fracture or worrisome bone lesion. Bilateral hip DJD. Review of the MIP images confirms the above findings. IMPRESSION: 1. Positive for acute bilateral peripheral pulmonary emboli. 2. Acute thrombus right paraspinal or ovarian vein draining into the juxtarenal IVC. Critical Value/emergent results were called by telephone at the time of interpretation on 01/15/2021 at 6:13 pm to provider Paris Loreebekah R Sponseller, who verbally acknowledged these results. 3. Coronary and Aortic Atherosclerosis (ICD10-170.0). Electronically Signed   By: Corlis Leak  Hassell M.D.   On: 01/15/2021 18:18   DG Chest Portable 1 View  Result Date: 01/15/2021 CLINICAL DATA:  Dyspnea. EXAM: PORTABLE CHEST 1 VIEW COMPARISON:  Chest x-ray 12/28/2020. FINDINGS: The aorta is ectatic. Cardiac silhouette is within normal limits. There is no focal lung infiltrate, pleural effusion or pneumothorax. No acute fractures are seen. IMPRESSION: No active disease. Electronically Signed   By: Darliss CheneyAmy  Guttmann M.D.   On: 01/15/2021 16:19   CT HEAD CODE STROKE WO CONTRAST  Result Date: 01/15/2021 CLINICAL DATA:  Code stroke. EXAM: CT HEAD WITHOUT CONTRAST TECHNIQUE: Contiguous axial images were obtained from the base of the skull through the vertex without intravenous contrast.  COMPARISON:  Brain MRI 02/23/2011, CT head 01/08/2021 FINDINGS: Brain: There is no evidence of acute intracranial hemorrhage, extra-axial fluid collection, or acute infarct. Mild global parenchymal volume loss with slightly disproportionate hippocampal atrophy is unchanged. The ventricles are stable in size. There is no mass lesion. There is no midline shift. Vascular: There is calcification of the bilateral cavernous ICAs. No dense vessel is seen. Skull: Normal. Negative for fracture or focal lesion. Sinuses/Orbits: The imaged paranasal sinuses are clear. The globes and orbits are unremarkable. Other: None. ASPECTS [facility]([place] Stroke Program Early CT Score) - Ganglionic level infarction (caudate, lentiform nuclei, internal capsule, insula, M1-M3 cortex): 7 - Supraganglionic infarction (M4-M6 cortex): 3 Total score (0-10 with 10 being normal): 10 IMPRESSION: 1. No acute intracranial pathology. No significant interval change since 01/08/2021. 2. ASPECTS is 10 These results were paged via amion at the time of interpretation on 01/15/2021 at 1:31 pm to provider Dr Selina CooleyStack. Electronically Signed   By: Lesia HausenPeter  Noone M.D.   On: 01/15/2021 13:33    EKG: Independently reviewed.  Assessment/Plan Principal Problem:   Acute pulmonary embolism (HCC) Active Problems:   Dementia (HCC)   DM2 (diabetes mellitus, type 2) (HCC)   HTN (hypertension)   Hypothyroidism    Acute PE - Seems to be coming from ovarian vein thrombosis No suspicious adnexal masses on CT, s/p hysterectomy in past. Not sure if any  further work up required here: may want to curbside Gyn in AM and see what they recommend. Heparin gtt Tele monitor 2d echo US BLE Biggest concern will be keeping patient from falling and hitting head while on blood thinners. Dementia - Seems fairly advanced at baseline With a component of adult failure to thrive at this point based on recent history, poor po intake, etc. May want to get Pal care involved regarding  long term GOC discussion. Continue home meds DM2 - Due to poor PO intake: most recent A1C 5.7, pt taken off of all DM meds as of last hospital stay. HTN - Holding home BP meds for the moment Hypothyroidism - Cont synthroid 75 (recently reduced as outpt).  DVT prophylaxis: Heparin gtt Code Status: Full Family Communication: Family at bedside Disposition Plan: Home after treatment for PE Consults called: None Admission status: Admit to inpatient  Severity of Illness: The appropriate patient status for this patient is INPATIENT. Inpatient status is judged to be reasonable and necessary in order to provide the required intensity of service to ensure the patient's safety. The patient's presenting symptoms, physical exam findings, and initial radiographic and laboratory data in the context of their chronic comorbidities is felt to place them at high risk for further clinical deterioration. Furthermore, it is not anticipated that the patient will be medically stable for discharge from the hospital within 2 midnights of admission.   * I certify that at the point of admission it is my clinical judgment that the patient will require inpatient hospital care spanning beyond 2 midnights from the point of admission due to high intensity of service, high risk for further deterioration and high frequency of surveillance required.*   Chaynce Schafer M. DO [facility]  How to contact the [facility] Attending or Consulting provider 7A - 7P or covering provider during after hours 7P -7A, for this patient?  Check the care team in [facility] and look for a) attending/consulting [facility] provider listed and b) the [facility] Hospital team listed Log into [email or web address]  Amion Physician Scheduling and messaging for groups and whole hospitals  On call and physician scheduling software for group practices, residents, hospitalists and other medical providers for call, clinic, rotation and shift schedules. OnCall [employer] is a hospital-wide  system for scheduling doctors and paging doctors on call. EasyPlot is for scientific plotting and data analysis.  [email or web address]  and use [employer]'s universal password to access. If you do not have the password, please contact the hospital operator.  Locate the [facility] provider you are looking for under [facility] and page to a number that you can be directly reached. If you still have difficulty reaching the provider, please page the [facility] (Director on Call) for the Hospitalists listed on amion for assistance.  01/15/2021, 8:08 PM

## 2021-01-15 NOTE — ED Notes (Signed)
Code stroke activated per RN Sima Matas request, spoke with [place].

## 2021-01-15 NOTE — Code Documentation (Signed)
Stroke Response Nurse Documentation °Code Documentation ° °April Carlson is a 71 y.o. female arriving to [facility] ED via [facility] on 1/5/202 with past medical hx of diabetes, CKD stage 2, hyperlipidemia, essential hypertension, hypothyroidism, dementia, and recent hospitalization related to falls.  ° °Patient was eating breakfast with her daughter. Daughter reports patient choked, slumped to her side and was unresponsive for approximately 15 minutes. EMS brought in as syncopal episode. Code stroke was activated by ED when noted right sided weakness.  ° °Stroke team met patient in CT. Labs done and EDP cleared patient in triage. NIHSS 12, see documentation for details and code stroke times. Patient with disoriented, right facial droop, bilateral arm weakness, bilateral leg weakness, and Global aphasia  on exam. The following imaging was completed:  CT. Patient is not a candidate for IV Thrombolytic due to stroke not suspected. ° °Care/Plan: reassess with daughter at bedside to determine if some symptoms are related to dementia and determine plan. ° °Bedside handoff with ED RN Sarah.   ° °   °Stroke Response RN °  °

## 2021-01-15 NOTE — ED Provider Triage Note (Addendum)
Emergency Medicine Provider Triage Evaluation Note  April Carlson , a 71 y.o. female  was evaluated in triage.  Pt complains of syncope.  Patient arrives with 2 family members they state there are breakfast this morning when the patient suddenly rolled her eyes back and fell over to the side.  She did not actually fall off the bench she was sitting on her hit her head however she did lose consciousness.  She was recently admitted on 01/08/2021 for unwitnessed fall.  In triage patient has left-sided facial droop.  Family states that this has not been present until today.  Patient is leaning to the side in triage.  She is intermittently responsive with me.  She does endorse sensation discrepancy on her forehead and bilateral cheeks.  She is unable to perform pronator drift for me. LKW ~10AM   Review of Systems  Positive: See above Negative:   Physical Exam  BP (!) 136/96 (BP Location: Left Arm)    Pulse 69    Temp 98.4 F (36.9 C) (Oral)    Resp 14    SpO2 100%  Gen:   Lethargic Resp:  Normal effort  MSK:   Weakness to all extremities, unable to perform pronator drift Other:  Left-sided facial droop.  Medical Decision Making  Medically screening exam initiated at 1:05 PM.  Appropriate orders placed.  April Carlson was informed that the remainder of the evaluation will be completed by another provider, this initial triage assessment does not replace that evaluation, and the importance of remaining in the ED until their evaluation is complete.     Cristopher Peru, PA-C 01/15/21 1308    Cristopher Peru, PA-C 01/15/21 1312

## 2021-01-15 NOTE — ED Triage Notes (Signed)
Pt via EMS-called for syncopal episode. On their arrival she was lying on a bench moaning and having tremors in BUE. Pt fell on week ago and was d/c on Tuesday and today has severe headache. No fall or trauma with syncope today-last fall was 12/28. Hx dementia.

## 2021-01-16 ENCOUNTER — Inpatient Hospital Stay (HOSPITAL_COMMUNITY): Payer: Medicare Other

## 2021-01-16 DIAGNOSIS — G9340 Encephalopathy, unspecified: Secondary | ICD-10-CM | POA: Diagnosis not present

## 2021-01-16 DIAGNOSIS — R5381 Other malaise: Secondary | ICD-10-CM

## 2021-01-16 DIAGNOSIS — F0390 Unspecified dementia without behavioral disturbance: Secondary | ICD-10-CM

## 2021-01-16 DIAGNOSIS — I2699 Other pulmonary embolism without acute cor pulmonale: Secondary | ICD-10-CM

## 2021-01-16 DIAGNOSIS — R609 Edema, unspecified: Secondary | ICD-10-CM | POA: Diagnosis not present

## 2021-01-16 DIAGNOSIS — E119 Type 2 diabetes mellitus without complications: Secondary | ICD-10-CM | POA: Diagnosis not present

## 2021-01-16 DIAGNOSIS — R55 Syncope and collapse: Secondary | ICD-10-CM | POA: Diagnosis not present

## 2021-01-16 DIAGNOSIS — G301 Alzheimer's disease with late onset: Secondary | ICD-10-CM | POA: Diagnosis not present

## 2021-01-16 LAB — ECHOCARDIOGRAM COMPLETE
AR max vel: 1.96 cm2
AV Area VTI: 1.91 cm2
AV Area mean vel: 1.92 cm2
AV Mean grad: 5 mmHg
AV Peak grad: 9.7 mmHg
Ao pk vel: 1.56 m/s
Area-P 1/2: 2.96 cm2
S' Lateral: 2.6 cm
Weight: 2215.18 oz

## 2021-01-16 LAB — BASIC METABOLIC PANEL
Anion gap: 9 (ref 5–15)
BUN: 5 mg/dL — ABNORMAL LOW (ref 8–23)
CO2: 23 mmol/L (ref 22–32)
Calcium: 8.5 mg/dL — ABNORMAL LOW (ref 8.9–10.3)
Chloride: 104 mmol/L (ref 98–111)
Creatinine, Ser: 0.88 mg/dL (ref 0.44–1.00)
GFR, Estimated: >60 mL/min (ref >60)
Glucose, Bld: 80 mg/dL (ref 70–99)
Potassium: 3.5 mmol/L (ref 3.5–5.1)
Sodium: 136 mmol/L (ref 135–145)

## 2021-01-16 LAB — CBC
HCT: 33.4 % — ABNORMAL LOW (ref 36.0–46.0)
Hemoglobin: 11.2 g/dL — ABNORMAL LOW (ref 12.0–15.0)
MCH: 29.6 pg (ref 26.0–34.0)
MCHC: 33.5 g/dL (ref 30.0–36.0)
MCV: 88.1 fL (ref 80.0–100.0)
Platelets: 187 10*3/uL (ref 150–400)
RBC: 3.79 MIL/uL — ABNORMAL LOW (ref 3.87–5.11)
RDW: 14.6 % (ref 11.5–15.5)
WBC: 4.9 10*3/uL (ref 4.0–10.5)
nRBC: 0 % (ref 0.0–0.2)

## 2021-01-16 LAB — HEPARIN LEVEL (UNFRACTIONATED)
Heparin Unfractionated: 0.75 IU/mL — ABNORMAL HIGH (ref 0.30–0.70)
Heparin Unfractionated: 0.71 IU/mL — ABNORMAL HIGH (ref 0.30–0.70)

## 2021-01-16 NOTE — Progress Notes (Signed)
ANTICOAGULATION CONSULT NOTE - Follow Up Consult  Pharmacy Consult for IV heparin Indication: pulmonary embolus  No Known Allergies  Patient Measurements: Weight: 62.8 kg (138 lb 7.2 oz) (weight minus wheelchair) Heparin Dosing Weight: 58.6 kg  Vital Signs: BP: 139/56 (01/06 1730) Pulse Rate: 56 (01/06 1730)  Labs: Recent Labs    01/15/21 1312 01/15/21 1318 01/16/21 0524 01/16/21 1500  HGB 11.0* 11.2* 11.2*  --   HCT 33.4* 33.0* 33.4*  --   PLT 189  --  187  --   APTT 28  --   --   --   LABPROT 16.4*  --   --   --   INR 1.3*  --   --   --   HEPARINUNFRC  --   --  0.75* 0.71*  CREATININE 0.95 0.80 0.88  --      CrCl cannot be calculated (Unknown ideal weight.).   Medical History: Past Medical History:  Diagnosis Date   Dementia (HCC)    Diabetes mellitus without complication (HCC)    High cholesterol    Hypertension    Thyroid disease     Assessment: 50 YOF presenting to ED with loss of consciousness. CT positive for acute bilateral peripheral PE. No history of anticoagulation PTA. Pharmacy to dose IV heparin.  Heparin level remains slightly supratherapeutic at 0.71 on 950 units/hr  Goal of Therapy:  Heparin level 0.3-0.7 units/ml Monitor platelets by anticoagulation protocol: Yes   Plan:  Decrease heparin gtt to 900 units/hr F/u heparin level with AM labs  Daylene Posey, PharmD Clinical Pharmacist ED Pharmacist Phone # [phone] 01/16/2021 6:03 PM

## 2021-01-16 NOTE — Progress Notes (Signed)
Bilateral lower extremity venous duplex completed. Refer to "CV Proc" under chart review to view preliminary results.  01/16/2021 3:04 PM Eula Fried., MHA, RVT, RDCS, RDMS

## 2021-01-16 NOTE — Progress Notes (Signed)
PROGRESS NOTE  April Carlson GYI:[id number] DOB: 1950/12/08 DOA: 01/15/2021 PCP: Redmond School, NP  Brief History   April Carlson is a 71 y.o. female with medical history significant of DM2, HTN, HLD, dementia.   Pt with recent admission to [facility] 12/29-1/3 for fall, found to have AKI, improved with holding lisinopril and diuretics.  Felt to be related to recent poor PO intake.   Today pt with episode of syncope ~1000 while eating.  Lasted ~15-20 mins.  Associated with generalized jerking, followed by facial droop, AMS.   Has continued to have myoclonic jerking since after episode.  Also posterior headache.   No fall nor traumatic injury during witnessed episode.     ED Course: Initially a code stroke, neurology felt stroke unlikely.   CT head neg.   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    The patient has been admitted to a telemetry bed. Neurology has been consulted and the work up is in place. She is receiving a heparin drip. Per neurology the concern is for metabolic or toxic encephalopathy.   Palliative care has been consulted.   Consultants  neurology  Procedures  None  Antibiotics   Anti-infectives (From admission, onward)    None      Subjective  The patient is resting quietly while echocardiogram is being performed. No new complaints.   Objective   Vitals:  Vitals:   01/16/21 1100 01/16/21 1300  BP: (!) 122/47 (!) 151/92  Pulse: 65 79  Resp: 19 16  Temp:    SpO2: 100% 100%    Exam:  Constitutional:  The patient is awake and alert. She is not oriented. No acute distress. Respiratory:  No increased work of breathing. No wheezes, rales, or rhonchi No tactile fremitus Cardiovascular:  Regular rate and rhythm No murmurs, ectopy, or gallups. No lateral PMI. No thrills. Abdomen:  Abdomen is soft, non-tender,  non-distended No hernias, masses, or organomegaly Normoactive bowel sounds.  Musculoskeletal:  No cyanosis, clubbing, or edema Skin:  No rashes, lesions, ulcers palpation of skin: no induration or nodules Neurologic:  Pt is moving all extremities. No focal neurological deficits Psychiatric:  Mental status Mood, affect pleasant/confused   I have personally reviewed the following:   Today's Data  Vitals  Lab Data  CMP, CBC  Micro Data  Blood culture x 2 No growth  Imaging  CTA chest CT Abdomen and pelvis  Cardiology Data  EKG Echocardiogram  Other Data    Scheduled Meds:  donepezil  5 mg Oral QHS   levothyroxine  75 mcg Oral Q0600   simvastatin  40 mg Oral QHS   cyanocobalamin  100 mcg Oral Daily   [START ON 01/19/2021] Vitamin D (Ergocalciferol)  50,000 Units Oral Q Mon   Continuous Infusions:  heparin 950 Units/hr (01/16/21 0933)    Principal Problem:   Acute pulmonary embolism (HCC) Active Problems:   Dementia (HCC)   DM2 (diabetes mellitus, type 2) (HCC)   HTN (hypertension)   Hypothyroidism   LOS: 1 day   A & P  Acute PE - Seems to be coming from ovarian vein thrombosis No suspicious adnexal masses on CT, s/p hysterectomy in past. Not sure if any further work up required here: may want to curbside Gyn in AM and see what they recommend. Heparin gtt Tele monitor 2d echo US BLE Biggest concern will be  keeping patient from falling and hitting head while on blood thinners. Dementia - Seems fairly advanced at baseline With a component of adult failure to thrive at this point based on recent history, poor po intake, etc. May want to get Pal care involved regarding long term GOC discussion. Continue home meds DM2 - Due to poor PO intake: most recent A1C 5.7, pt taken off of all DM meds as of last hospital stay. HTN - Holding home BP meds for the moment Hypothyroidism - Cont synthroid 75 (recently reduced as outpt).  I have seen and examined  this patient myself. I have spent 34 minutes in her evaluation and care.  DVT prophylaxis: Heparin gtt CODE STATUS: Full Code Family communication: Daughter at bedside Disposition: tbd.   April Mesina, DO [facility] Direct contact: see [email or web address]  7PM-7AM contact night coverage as above 01/16/2021, 1:58 PM  LOS: 1 day

## 2021-01-16 NOTE — Progress Notes (Signed)
Echocardiogram 2D Echocardiogram has been performed.  Devonne Doughty 01/16/2021, 10:53 AM

## 2021-01-16 NOTE — ED Notes (Signed)
Pt c/o headache, PRN tylenol given

## 2021-01-16 NOTE — Progress Notes (Signed)
ANTICOAGULATION CONSULT NOTE - Follow Up Consult  Pharmacy Consult for IV heparin Indication: pulmonary embolus  No Known Allergies  Patient Measurements: Weight: 62.8 kg (138 lb 7.2 oz) (weight minus wheelchair) Heparin Dosing Weight: 58.6 kg  Vital Signs: BP: 158/56 (01/06 0533) Pulse Rate: 64 (01/06 0533)  Labs: Recent Labs    01/15/21 1312 01/15/21 1318 01/16/21 0524  HGB 11.0* 11.2* 11.2*  HCT 33.4* 33.0* 33.4*  PLT 189  --  187  APTT 28  --   --   LABPROT 16.4*  --   --   INR 1.3*  --   --   HEPARINUNFRC  --   --  0.75*  CREATININE 0.95 0.80 0.88     CrCl cannot be calculated (Unknown ideal weight.).   Medical History: Past Medical History:  Diagnosis Date   Dementia (HCC)    Diabetes mellitus without complication (HCC)    High cholesterol    Hypertension    Thyroid disease     Assessment: 27 YOF presenting to ED with loss of consciousness. CT positive for acute bilateral peripheral PE. No history of anticoagulation PTA. Pharmacy to dose IV heparin.  Initial HL is slightly supratherapeutic on 1000 units/hr. H/H low stable, Plt wnl   Goal of Therapy:  Heparin level 0.3-0.7 units/ml Monitor platelets by anticoagulation protocol: Yes   Plan:  Decrease heparin to 950 units/hr F/u 8 hr HL Monitor for s/s of bleeding   Vinnie Level, PharmD., BCPS, BCCCP Clinical Pharmacist Please refer to [facility] for unit-specific pharmacist

## 2021-01-16 NOTE — Evaluation (Signed)
Clinical/Bedside Swallow Evaluation Patient Details  Name: April Carlson MRN: [id number] Date of Birth: 13-Jul-1950  Today's Date: 01/16/2021 Time: SLP Start Time (ACUTE ONLY): 0900 SLP Stop Time (ACUTE ONLY): 0915 SLP Time Calculation (min) (ACUTE ONLY): 15 min  Past Medical History:  Past Medical History:  Diagnosis Date   Dementia (HCC)    Diabetes mellitus without complication (HCC)    High cholesterol    Hypertension    Thyroid disease    Past Surgical History:  Past Surgical History:  Procedure Laterality Date   ABDOMINAL HYSTERECTOMY     THYROIDECTOMY     HPI:  71yo female admitted 01/15/21 with syncope and headache. PMH: DM2, CKD2, HLD, HTN, hypothyroidism, dementia    Assessment / Plan / Recommendation  Clinical Impression  Pt seen at bedside for assessment of swallow function and safety. CN exam unremarkable. Pt reports natural dentition, but is missing some teeth. Pt was observed eating breakfast of regular solids and thin liquids via cup and straw. No obvious oral issues or overt s/s aspiration observed on any texture. RN reports no difficulty with PO medications. Recommend continuing with current diet. No further ST intervention recommended at this time. Please reconsult if needs arise.  SLP Visit Diagnosis: Dysphagia, unspecified (R13.10)    Aspiration Risk  Mild aspiration risk    Diet Recommendation Regular;Thin liquid   Liquid Administration via: Cup;Straw Medication Administration: Whole meds with liquid Supervision: Patient able to self feed Compensations: Minimize environmental distractions;Slow rate;Small sips/bites Postural Changes: Seated upright at 90 degrees    Other  Recommendations Oral Care Recommendations: Oral care BID    Recommendations for follow up therapy are one component of a multi-disciplinary discharge planning process, led by the attending physician.  Recommendations may be updated based on patient status, additional functional criteria  and insurance authorization.  Follow up Recommendations Follow physician's recommendations for discharge plan and follow up therapies      Assistance Recommended at Discharge Frequent or constant Supervision/Assistance  Functional Status Assessment Patient has not had a recent decline in their functional status      Prognosis Prognosis for Safe Diet Advancement: Good Barriers to Reach Goals: Cognitive deficits      Swallow Study   General Date of Onset: 01/15/21 HPI: 71yo female admitted 01/15/21 with syncope and headache. PMH: DM2, CKD2, HLD, HTN, hypothyroidism, dementia Type of Study: Bedside Swallow Evaluation Previous Swallow Assessment: none Diet Prior to this Study: Regular;Thin liquids Temperature Spikes Noted: No Respiratory Status: Room air History of Recent Intubation: No Behavior/Cognition: Alert;Cooperative;Pleasant mood;Confused Oral Cavity Assessment: Within Functional Limits Oral Care Completed by SLP: No Oral Cavity - Dentition: Adequate natural dentition;Missing dentition Vision: Functional for self-feeding Self-Feeding Abilities: Able to feed self Patient Positioning: Upright in bed Baseline Vocal Quality: Normal Volitional Cough: Strong Volitional Swallow: Able to elicit    Oral/Motor/Sensory Function Overall Oral Motor/Sensory Function: Within functional limits   Ice Chips     Thin Liquid Thin Liquid: Within functional limits Presentation: Cup;Straw    Nectar Thick Nectar Thick Liquid: Not tested   Honey Thick Honey Thick Liquid: Not tested   Puree Puree: Within functional limits Presentation: Self Fed   Solid     Solid: Within functional limits Presentation: Self Fed     April Carlson B. Murvin Natal, [facility], CCC-SLP Speech Language Pathologist Office: [phone]  Leigh Aurora 01/16/2021,9:26 AM

## 2021-01-17 DIAGNOSIS — G9340 Encephalopathy, unspecified: Secondary | ICD-10-CM | POA: Diagnosis not present

## 2021-01-17 DIAGNOSIS — G301 Alzheimer's disease with late onset: Secondary | ICD-10-CM | POA: Diagnosis not present

## 2021-01-17 DIAGNOSIS — I2699 Other pulmonary embolism without acute cor pulmonale: Secondary | ICD-10-CM | POA: Diagnosis not present

## 2021-01-17 DIAGNOSIS — E119 Type 2 diabetes mellitus without complications: Secondary | ICD-10-CM | POA: Diagnosis not present

## 2021-01-17 LAB — BASIC METABOLIC PANEL
Anion gap: 8 (ref 5–15)
BUN: 7 mg/dL — ABNORMAL LOW (ref 8–23)
CO2: 23 mmol/L (ref 22–32)
Calcium: 8.6 mg/dL — ABNORMAL LOW (ref 8.9–10.3)
Chloride: 107 mmol/L (ref 98–111)
Creatinine, Ser: 0.94 mg/dL (ref 0.44–1.00)
GFR, Estimated: >60 mL/min (ref >60)
Glucose, Bld: 78 mg/dL (ref 70–99)
Potassium: 3.3 mmol/L — ABNORMAL LOW (ref 3.5–5.1)
Sodium: 138 mmol/L (ref 135–145)

## 2021-01-17 LAB — CBC
HCT: 32 % — ABNORMAL LOW (ref 36.0–46.0)
Hemoglobin: 10.6 g/dL — ABNORMAL LOW (ref 12.0–15.0)
MCH: 29 pg (ref 26.0–34.0)
MCHC: 33.1 g/dL (ref 30.0–36.0)
MCV: 87.7 fL (ref 80.0–100.0)
Platelets: 183 10*3/uL (ref 150–400)
RBC: 3.65 MIL/uL — ABNORMAL LOW (ref 3.87–5.11)
RDW: 14.6 % (ref 11.5–15.5)
WBC: 4.9 10*3/uL (ref 4.0–10.5)
nRBC: 0 % (ref 0.0–0.2)

## 2021-01-17 LAB — HEPARIN LEVEL (UNFRACTIONATED): Heparin Unfractionated: 0.64 IU/mL (ref 0.30–0.70)

## 2021-01-17 MED ORDER — APIXABAN 5 MG PO TABS: 5.0000 mg | ORAL_TABLET | Freq: Two times a day (BID) | ORAL | Status: DC

## 2021-01-17 MED ORDER — APIXABAN 5 MG PO TABS
10.0000 mg | ORAL_TABLET | Freq: Two times a day (BID) | ORAL | Status: DC
  Administered 2021-01-17 – 2021-01-18 (×2): 10 mg via ORAL
  Filled 2021-01-17 (×2): qty 2

## 2021-01-17 MED ORDER — POTASSIUM CHLORIDE CRYS ER 20 MEQ PO TBCR
40.0000 meq | EXTENDED_RELEASE_TABLET | Freq: Once | ORAL | Status: AC
  Administered 2021-01-17: 40 meq via ORAL
  Filled 2021-01-17: qty 2

## 2021-01-17 MED ORDER — OXYCODONE HCL 5 MG PO TABS
5.0000 mg | ORAL_TABLET | Freq: Once | ORAL | Status: AC
  Administered 2021-01-17: 5 mg via ORAL
  Filled 2021-01-17: qty 1

## 2021-01-17 NOTE — Progress Notes (Signed)
ANTICOAGULATION CONSULT NOTE - Follow Up Consult  Pharmacy Consult for IV heparin>>apixaban Indication: pulmonary embolus  No Known Allergies  Patient Measurements: Weight: 62.8 kg (138 lb 7.2 oz) (weight minus wheelchair) Heparin Dosing Weight: 58.6 kg  Vital Signs: Temp: 98.3 F (36.8 C) (01/07 1639) Temp Source: Oral (01/07 1639) BP: 133/56 (01/07 1639) Pulse Rate: 61 (01/07 1639)  Labs: Recent Labs    01/15/21 1312 01/15/21 1318 01/16/21 0524 01/16/21 1500 01/17/21 0318  HGB 11.0* 11.2* 11.2*  --  10.6*  HCT 33.4* 33.0* 33.4*  --  32.0*  PLT 189  --  187  --  183  APTT 28  --   --   --   --   LABPROT 16.4*  --   --   --   --   INR 1.3*  --   --   --   --   HEPARINUNFRC  --   --  0.75* 0.71* 0.64  CREATININE 0.95 0.80 0.88  --  0.94     CrCl cannot be calculated (Unknown ideal weight.).   Medical History: Past Medical History:  Diagnosis Date   Dementia ([place])    Diabetes mellitus without complication (HCC)    High cholesterol    Hypertension    Thyroid disease     Assessment: April Carlson presenting to ED with loss of consciousness. CT positive for acute bilateral peripheral PE for possible ovarian or paraspinal vein thrombosis. No history of anticoagulation PTA. Pharmacy to dose IV heparin.  Heparin level now therapeutic 1/7 AM with heparin running at 900 units/h. CBC stable, PLT WNL.   New orders to transition to apixaban tonight.  Goal of Therapy:  Heparin level 0.3-0.7 units/ml Monitor platelets by anticoagulation protocol: Yes   Plan:  Apixaban 10mg  bid x 7 days then 5mg  bid  Erin Hearing PharmD., BCPS Clinical Pharmacist 01/17/2021 6:39 PM

## 2021-01-17 NOTE — Progress Notes (Signed)
ANTICOAGULATION CONSULT NOTE - Follow Up Consult  Pharmacy Consult for IV heparin Indication: pulmonary embolus  No Known Allergies  Patient Measurements: Weight: 62.8 kg (138 lb 7.2 oz) (weight minus wheelchair) Heparin Dosing Weight: 58.6 kg  Vital Signs: Temp: 98.3 F (36.8 C) (01/07 0459) BP: 149/64 (01/07 0459) Pulse Rate: 56 (01/07 0459)  Labs: Recent Labs    01/15/21 1312 01/15/21 1318 01/16/21 0524 01/16/21 1500 01/17/21 0318  HGB 11.0* 11.2* 11.2*  --  10.6*  HCT 33.4* 33.0* 33.4*  --  32.0*  PLT 189  --  187  --  183  APTT 28  --   --   --   --   LABPROT 16.4*  --   --   --   --   INR 1.3*  --   --   --   --   HEPARINUNFRC  --   --  0.75* 0.71* 0.64  CREATININE 0.95 0.80 0.88  --  0.94     CrCl cannot be calculated (Unknown ideal weight.).   Medical History: Past Medical History:  Diagnosis Date   Dementia ([place])    Diabetes mellitus without complication (HCC)    High cholesterol    Hypertension    Thyroid disease     Assessment: 28 YOF presenting to ED with loss of consciousness. CT positive for acute bilateral peripheral PE for possible ovarian or paraspinal vein thrombosis. No history of anticoagulation PTA. Pharmacy to dose IV heparin.  Heparin level now therapeutic 1/7 AM with heparin running at 900 units/h. CBC stable, PLT WNL.   Goal of Therapy:  Heparin level 0.3-0.7 units/ml Monitor platelets by anticoagulation protocol: Yes   Plan:  Continue heparin gtt to 900 units/hr Daily HL, CBC Monitor for signs and symptoms of bleeding  Adria Dill, PharmD PGY-1 Acute Care Resident  01/17/2021 8:36 AM

## 2021-01-17 NOTE — Plan of Care (Signed)
  Problem: Clinical Measurements: Goal: Will remain free from infection Outcome: Adequate for Discharge   

## 2021-01-17 NOTE — Progress Notes (Signed)
PROGRESS NOTE  Suhani Stillion TDV:[id number] DOB: 10-Oct-1950 DOA: 01/15/2021 PCP: [employer]mond [employer], NP  Brief History   Tykerria Mccubbins is a 71 y.o. female with medical history significant of DM2, HTN, HLD, dementia.   Pt with recent admission to [facility] 12/29-1/3 for fall, found to have AKI, improved with holding lisinopril and diuretics.  Felt to be related to recent poor PO intake.   Today pt with episode of syncope ~1000 while eating.  Lasted ~15-20 mins.  Associated with generalized jerking, followed by facial droop, AMS.   Has continued to have myoclonic jerking since after episode.  Also posterior headache.   No fall nor traumatic injury during witnessed episode.     ED Course: Initially a code stroke, neurology felt stroke unlikely.   CT head neg.   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   The patient has been admitted to a telemetry bed. Neurology has been consulted and the work up is in place. She is receiving a heparin drip. Per neurology the concern is for metabolic or toxic encephalopathy.   I have discussed the patient with gynecology. They do not feel that the ovarian vein thrombosis requires work-up, but will check with Gyne onc.  Palliative care has been consulted.   Consultants  Neurology  Procedures  None  Antibiotics   Anti-infectives (From admission, onward)    None      Subjective  The patient is resting quietly while echocardiogram is being performed. No new complaints.   Objective   Vitals:  Vitals:   01/17/21 0941 01/17/21 1639  BP: (!) 161/59 (!) 133/56  Pulse: (!) 55 61  Resp: 17 17  Temp: 98 F (36.7 C) 98.3 F (36.8 C)  SpO2: 100% 97%    Exam:  Constitutional:  The patient is awake and alert. She is not oriented. No acute distress. Respiratory:  No increased work of breathing. No wheezes, rales, or  rhonchi No tactile fremitus Cardiovascular:  Regular rate and rhythm No murmurs, ectopy, or gallups. No lateral PMI. No thrills. Abdomen:  Abdomen is soft, non-tender, non-distended No hernias, masses, or organomegaly Normoactive bowel sounds.  Musculoskeletal:  No cyanosis, clubbing, or edema Skin:  No rashes, lesions, ulcers palpation of skin: no induration or nodules Neurologic:  Pt is moving all extremities. No focal neurological deficits Psychiatric:  Mental status Mood, affect pleasant/confused  I have personally reviewed the following:   Today's Data  Vitals  Lab Data  CMP, CBC  Micro Data  Blood culture x 2 No growth  Imaging  CTA chest CT Abdomen and pelvis  Cardiology Data  EKG Echocardiogram  Other Data    Scheduled Meds:  donepezil  5 mg Oral QHS   levothyroxine  75 mcg Oral Q0600   simvastatin  40 mg Oral QHS   cyanocobalamin  100 mcg Oral Daily   [START ON 01/19/2021] Vitamin D (Ergocalciferol)  50,000 Units Oral Q Mon   Continuous Infusions:  heparin 900 Units/hr (01/17/21 1521)    Principal Problem:   Acute pulmonary embolism (HCC) Active Problems:   Dementia (HCC)   DM2 (diabetes mellitus, type 2) (HCC)   HTN (hypertension)   Hypothyroidism   LOS: 2 days   A & P  Acute PE - Seems to be coming from ovarian vein thrombosis No suspicious adnexal masses on CT, s/p hysterectomy in past. Not sure if  any further work up required here: may want to curbside Gyn in AM and see what they recommend. Heparin gtt will be transitioned to Eliquis Tele monitor 2d echo US BLE Biggest concern will be keeping patient from falling and hitting head while on blood thinners. Ovarian Vein thrombosis: Discussed with Gynecology. They do not feel that the ovarian vein thrombosis requires work-up, but will check with Gyne onc. Dementia - Seems fairly advanced at baseline With a component of adult failure to thrive at this point based on recent history,  poor po intake, etc. May want to get Pal care involved regarding long term GOC discussion. Continue home meds DM2 - Due to poor PO intake: most recent A1C 5.7, pt taken off of all DM meds as of last hospital stay. HTN - Holding home BP meds for the moment Hypothyroidism - Cont synthroid 75 (recently reduced as outpt).  I have seen and examined this patient myself. I have spent 32 minutes in her evaluation and care.  DVT prophylaxis: Heparin gtt CODE STATUS: Full Code Family communication: Daughter at bedside Disposition: tbd.   Jackelyne Sayer, DO [facility] Direct contact: see [email or web address]  7PM-7AM contact night coverage as above 01/17/2021, 6:25 PM  LOS: 1 day

## 2021-01-18 ENCOUNTER — Inpatient Hospital Stay (HOSPITAL_COMMUNITY): Payer: Medicare Other

## 2021-01-18 LAB — CBC
HCT: 30.3 % — ABNORMAL LOW (ref 36.0–46.0)
Hemoglobin: 10.3 g/dL — ABNORMAL LOW (ref 12.0–15.0)
MCH: 29.4 pg (ref 26.0–34.0)
MCHC: 34 g/dL (ref 30.0–36.0)
MCV: 86.6 fL (ref 80.0–100.0)
Platelets: 183 10*3/uL (ref 150–400)
RBC: 3.5 MIL/uL — ABNORMAL LOW (ref 3.87–5.11)
RDW: 14.6 % (ref 11.5–15.5)
WBC: 5.7 10*3/uL (ref 4.0–10.5)
nRBC: 0 % (ref 0.0–0.2)

## 2021-01-18 LAB — BASIC METABOLIC PANEL
Anion gap: 4 — ABNORMAL LOW (ref 5–15)
BUN: 7 mg/dL — ABNORMAL LOW (ref 8–23)
CO2: 24 mmol/L (ref 22–32)
Calcium: 8.4 mg/dL — ABNORMAL LOW (ref 8.9–10.3)
Chloride: 106 mmol/L (ref 98–111)
Creatinine, Ser: 0.95 mg/dL (ref 0.44–1.00)
GFR, Estimated: >60 mL/min (ref >60)
Glucose, Bld: 83 mg/dL (ref 70–99)
Potassium: 3.7 mmol/L (ref 3.5–5.1)
Sodium: 134 mmol/L — ABNORMAL LOW (ref 135–145)

## 2021-01-18 MED ORDER — APIXABAN 5 MG PO TABS: 10.0000 mg | ORAL_TABLET | Freq: Two times a day (BID) | ORAL | 0 refills | Status: AC

## 2021-01-18 MED ORDER — TRAMADOL HCL 50 MG PO TABS: 50.0000 mg | ORAL_TABLET | Freq: Four times a day (QID) | ORAL | Status: DC | PRN

## 2021-01-18 MED ORDER — TRAMADOL HCL 50 MG PO TABS: 50.0000 mg | ORAL_TABLET | Freq: Four times a day (QID) | ORAL | 0 refills | Status: AC | PRN

## 2021-01-18 MED ORDER — LORAZEPAM 2 MG/ML IJ SOLN
0.5000 mg | Freq: Once | INTRAMUSCULAR | Status: AC
  Administered 2021-01-18: 0.5 mg via INTRAVENOUS
  Filled 2021-01-18: qty 1

## 2021-01-18 MED ORDER — APIXABAN 5 MG PO TABS: 5.0000 mg | ORAL_TABLET | Freq: Two times a day (BID) | ORAL | 0 refills | Status: AC

## 2021-01-18 NOTE — Progress Notes (Signed)
DISCHARGE NOTE HOME April Carlson to be discharged Home per MD order. Discussed prescriptions and follow up appointments with the patient. Prescriptions given to patient; medication list explained in detail. Patient verbalized understanding.  Skin clean, dry and intact without evidence of skin break down, no evidence of skin tears noted. IV catheter discontinued intact. Site without signs and symptoms of complications. Dressing and pressure applied. Pt denies pain at the site currently. No complaints noted.  Patient free of lines, drains, and wounds.   An After Visit Summary (AVS) was printed and given to the patient. Patient escorted via wheelchair, and discharged home via private auto.  [place], Kem Kays, RN

## 2021-01-18 NOTE — Progress Notes (Signed)
°  Transition of Care [facility]) Screening Note   Patient Details  Name: April Carlson Date of Birth: 31-May-1950   Transition of Care [facility]) CM/SW Contact:    Bess Kinds, RN Phone Number: [phone] 01/18/2021, 4:32 PM    Transition of Care Department [facility]) has reviewed patient and no TOC needs have been identified at this time. We will continue to monitor patient advancement through interdisciplinary progression rounds. If new patient transition needs arise, please place a TOC consult.

## 2021-01-20 LAB — CULTURE, BLOOD (ROUTINE X 2)
Culture: NO GROWTH
Special Requests: ADEQUATE

## 2021-02-27 NOTE — Discharge Summary (Signed)
Physician Discharge Summary   Patient: April Carlson MRN: [id number] DOB: Mar 24, 1950  Admit date:     01/15/2021  Discharge date: 01/18/2021  Discharge Physician: Fran Lowes   PCP: Redmond School, NP   Recommendations at discharge:    Discharge to home. Activity as tolerated.  Follow up with PCP in 7-10 days.  Chemistry to be drawn at that time. Pt will need to get yearly mammogram soon for surveillance purposes. She should remain up-to-date with all surveillance studies.  Discharge Diagnoses: Principal Problem:   Acute pulmonary embolism (HCC) Active Problems:   Dementia (HCC)   DM2 (diabetes mellitus, type 2) (HCC)   HTN (hypertension)   Hypothyroidism  Resolved Problems:   Acute encephalopathy   Hospital Course: Pt with recent admission to [facility] 12/29-1/3 for fall, found to have AKI, improved with holding lisinopril and diuretics.  Felt to be related to recent poor PO intake.   Today pt with episode of syncope ~1000 while eating.  Lasted ~15-20 mins.  Associated with generalized jerking, followed by facial droop, AMS.   Has continued to have myoclonic jerking since after episode.  Also posterior headache.   No fall nor traumatic injury during witnessed episode.     ED Course: Initially a code stroke, neurology felt stroke unlikely.   CT head neg.   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   The patient has been admitted to a telemetry bed. Neurology has been consulted and the work up is in place. She is receiving a heparin drip. Per neurology the concern is for metabolic or toxic encephalopathy.    I have discussed the patient with gynecology. They do not feel that the ovarian vein thrombosis requires work-up, but will check with Gyne onc.   Palliative care has been consulted.   The patient will be discharged to home today in fair  condition.  Assessment and Plan: Acute PE - Seems to be coming from ovarian vein thrombosis No suspicious adnexal masses on CT, s/p hysterectomy in past. Not sure if any further work up required here: may want to curbside Gyn in AM and see what they recommend. Heparin gtt will be transitioned to Eliquis Tele monitor 2d echo US BLE Biggest concern will be keeping patient from falling and hitting head while on blood thinners. Ovarian Vein thrombosis: Discussed with Gynecology. They do not feel that the ovarian vein thrombosis requires work-up, but will check with Gyne onc. Dementia - Seems fairly advanced at baseline With a component of adult failure to thrive at this point based on recent history, poor po intake, etc. May want to get Pal care involved regarding long term GOC discussion. Continue home meds DM2 - Due to poor PO intake: most recent A1C 5.7, pt taken off of all DM meds as of last hospital stay. HTN - Holding home BP meds for the moment Hypothyroidism - Cont synthroid 75 (recently reduced as outpt).   Consultants: Neurology Procedures performed: None  Disposition: Home Diet recommendation:  Discharge Diet Orders (From admission, onward)     Start     Ordered   01/18/21 0000  Diet - low sodium heart healthy        01/18/21 1611           Cardiac and Carb modified diet  DISCHARGE MEDICATION: Allergies as of 01/18/2021   No Known Allergies  Medication List     STOP taking these medications    lisinopril 10 MG tablet Commonly known as: ZESTRIL       TAKE these medications    alendronate 70 MG tablet Commonly known as: FOSAMAX Take 70 mg by mouth once a week. Take with a full glass of water on an empty stomach.   apixaban 5 MG Tabs tablet Commonly known as: ELIQUIS Take 2 tablets (10 mg total) by mouth 2 (two) times daily for 6 days.   apixaban 5 MG Tabs tablet Commonly known as: ELIQUIS Take 1 tablet (5 mg total) by mouth 2 (two) times  daily.   cyanocobalamin 100 MCG tablet Take 1 tablet by mouth daily.   donepezil 5 MG tablet Commonly known as: ARICEPT Take 5 mg by mouth daily.   levothyroxine 75 MCG tablet Commonly known as: SYNTHROID Take 75 mcg by mouth daily.   simvastatin 40 MG tablet Commonly known as: ZOCOR Take 40 mg by mouth at bedtime.   traMADol 50 MG tablet Commonly known as: ULTRAM Take 1 tablet (50 mg total) by mouth every 6 (six) hours as needed for moderate pain (Headache).   Vitamin D (Ergocalciferol) 1.25 MG (50000 UNIT) Caps capsule Commonly known as: DRISDOL Take 50,000 Units by mouth once a week.         Discharge Exam: Filed Weights   01/15/21 1301  Weight: 62.8 kg   Vitals:   01/18/21 1018 01/18/21 1633  BP: (!) 118/50 119/68  Pulse: 78 91  Resp: 17 17  Temp: 98 F (36.7 C) 98.2 F (36.8 C)  SpO2: 100% 98%   Exam:  Constitutional:  The patient is awake, alert, and oriented x 3. No acute distress. Respiratory:  No increased work of breathing. No wheezes, rales, or rhonchi No tactile fremitus Cardiovascular:  Regular rate and rhythm No murmurs, ectopy, or gallups. No lateral PMI. No thrills. Abdomen:  Abdomen is soft, non-tender, non-distended No hernias, masses, or organomegaly Normoactive bowel sounds.  Musculoskeletal:  No cyanosis, clubbing, or edema Skin:  No rashes, lesions, ulcers palpation of skin: no induration or nodules Neurologic:  CN 2-12 intact Sensation all 4 extremities intact Psychiatric:  Mental status Mood, affect appropriate Orientation to person, place, time  judgment and insight appear intact   Condition at discharge: fair  The results of significant diagnostics from this hospitalization (including imaging, microbiology, ancillary and laboratory) are listed below for reference.   Imaging Studies: No results found.  Microbiology: Results for orders placed or performed during the hospital encounter of 01/15/21  Resp Panel  by RT-PCR (Flu A&B, Covid) Nasopharyngeal Swab     Status: None   Collection Time: 01/15/21  1:06 PM   Specimen: Nasopharyngeal Swab; Nasopharyngeal(NP) swabs in vial transport medium  Result Value Ref Range Status   SARS Coronavirus 2 by RT PCR NEGATIVE NEGATIVE Final    Comment: (NOTE) SARS-CoV-2 target nucleic acids are NOT DETECTED.  The SARS-CoV-2 RNA is generally detectable in upper respiratory specimens during the acute phase of infection. The lowest concentration of SARS-CoV-2 viral copies this assay can detect is 138 copies/mL. A negative result does not preclude SARS-Cov-2 infection and should not be used as the sole basis for treatment or other patient management decisions. A negative result may occur with  improper specimen collection/handling, submission of specimen other than nasopharyngeal swab, presence of viral mutation(s) within the areas targeted by this assay, and inadequate number of viral copies(<138 copies/mL). A negative result must be combined with  clinical observations, patient history, and epidemiological information. The expected result is Negative.  Fact Sheet for Patients:  BloggerCourse.com  Fact Sheet for Healthcare Providers:  SeriousBroker.it  This test is no t yet approved or cleared by the [place] FDA and  has been authorized for detection and/or diagnosis of SARS-CoV-2 by FDA under an Emergency Use Authorization (EUA). This EUA will remain  in effect (meaning this test can be used) for the duration of the COVID-19 declaration under Section 564(b)(1) of the Act, 21 U.S.C.section 360bbb-3(b)(1), unless the authorization is terminated  or revoked sooner.       Influenza A by PCR NEGATIVE NEGATIVE Final   Influenza B by PCR NEGATIVE NEGATIVE Final    Comment: (NOTE) The Xpert Xpress SARS-CoV-2/FLU/RSV plus assay is intended as an aid in the diagnosis of influenza from Nasopharyngeal swab  specimens and should not be used as a sole basis for treatment. Nasal washings and aspirates are unacceptable for Xpert Xpress SARS-CoV-2/FLU/RSV testing.  Fact Sheet for Patients: BloggerCourse.com  Fact Sheet for Healthcare Providers: SeriousBroker.it  This test is not yet approved or cleared by the [place] FDA and has been authorized for detection and/or diagnosis of SARS-CoV-2 by FDA under an Emergency Use Authorization (EUA). This EUA will remain in effect (meaning this test can be used) for the duration of the COVID-19 declaration under Section 564(b)(1) of the Act, 21 U.S.C. section 360bbb-3(b)(1), unless the authorization is terminated or revoked.  Performed at [facility], [address], [place], [place] [address]   Blood culture (routine x 2)     Status: None   Collection Time: 01/15/21  4:15 PM   Specimen: BLOOD  Result Value Ref Range Status   Specimen Description BLOOD LEFT ANTECUBITAL  Final   Special Requests   Final    BOTTLES DRAWN AEROBIC AND ANAEROBIC Blood Culture adequate volume   Culture   Final    NO GROWTH 5 DAYS Performed at [facility], [address], [place], [place] [address]    Report Status 01/20/2021 FINAL  Final    Labs: CBC: No results for input(s): WBC, NEUTROABS, HGB, HCT, MCV, PLT in the last 168 hours. Basic Metabolic Panel: No results for input(s): NA, K, CL, CO2, GLUCOSE, BUN, CREATININE, CALCIUM, MG, PHOS in the last 168 hours. Liver Function Tests: No results for input(s): AST, ALT, ALKPHOS, BILITOT, PROT, ALBUMIN in the last 168 hours. CBG: No results for input(s): GLUCAP in the last 168 hours.  Discharge time spent: greater than 30 minutes.  Signed: Chimene Salo, DO [facility] 02/27/2021

## 2021-07-07 ENCOUNTER — Emergency Department (HOSPITAL_COMMUNITY): Payer: Medicare Other

## 2021-07-07 ENCOUNTER — Encounter (HOSPITAL_COMMUNITY): Payer: Self-pay

## 2021-07-07 ENCOUNTER — Emergency Department (HOSPITAL_COMMUNITY)
Admission: EM | Admit: 2021-07-07 | Discharge: 2021-07-07 | Disposition: A | Discharge status: Home / Self Care | Payer: Medicare Other | Attending: Student | Admitting: Student

## 2021-07-07 ENCOUNTER — Other Ambulatory Visit: Payer: Self-pay

## 2021-07-07 DIAGNOSIS — E119 Type 2 diabetes mellitus without complications: Secondary | ICD-10-CM | POA: Insufficient documentation

## 2021-07-07 DIAGNOSIS — F0390 Unspecified dementia without behavioral disturbance: Secondary | ICD-10-CM | POA: Insufficient documentation

## 2021-07-07 DIAGNOSIS — S0990XA Unspecified injury of head, initial encounter: Secondary | ICD-10-CM | POA: Insufficient documentation

## 2021-07-07 DIAGNOSIS — Z87891 Personal history of nicotine dependence: Secondary | ICD-10-CM | POA: Insufficient documentation

## 2021-07-07 DIAGNOSIS — W19XXXA Unspecified fall, initial encounter: Secondary | ICD-10-CM

## 2021-07-07 DIAGNOSIS — E039 Hypothyroidism, unspecified: Secondary | ICD-10-CM | POA: Insufficient documentation

## 2021-07-07 DIAGNOSIS — Z79899 Other long term (current) drug therapy: Secondary | ICD-10-CM | POA: Insufficient documentation

## 2021-07-07 DIAGNOSIS — M542 Cervicalgia: Secondary | ICD-10-CM | POA: Diagnosis not present

## 2021-07-07 DIAGNOSIS — I10 Essential (primary) hypertension: Secondary | ICD-10-CM | POA: Diagnosis not present

## 2021-07-07 DIAGNOSIS — Z7901 Long term (current) use of anticoagulants: Secondary | ICD-10-CM | POA: Diagnosis not present

## 2021-07-07 DIAGNOSIS — W108XXA Fall (on) (from) other stairs and steps, initial encounter: Secondary | ICD-10-CM | POA: Diagnosis not present

## 2021-07-07 LAB — COMPREHENSIVE METABOLIC PANEL
ALT: 19 U/L (ref 0–44)
AST: 22 U/L (ref 15–41)
Albumin: 3.8 g/dL (ref 3.5–5.0)
Alkaline Phosphatase: 51 U/L (ref 38–126)
Anion gap: 9 (ref 5–15)
BUN: 22 mg/dL (ref 8–23)
CO2: 23 mmol/L (ref 22–32)
Calcium: 9.6 mg/dL (ref 8.9–10.3)
Chloride: 108 mmol/L (ref 98–111)
Creatinine, Ser: 1.03 mg/dL — ABNORMAL HIGH (ref 0.44–1.00)
GFR, Estimated: 58 mL/min — ABNORMAL LOW (ref >60)
Glucose, Bld: 166 mg/dL — ABNORMAL HIGH (ref 70–99)
Potassium: 3.6 mmol/L (ref 3.5–5.1)
Sodium: 140 mmol/L (ref 135–145)
Total Bilirubin: 0.6 mg/dL (ref 0.3–1.2)
Total Protein: 6.4 g/dL — ABNORMAL LOW (ref 6.5–8.1)

## 2021-07-07 LAB — CBC
HCT: 37.4 % (ref 36.0–46.0)
Hemoglobin: 12.2 g/dL (ref 12.0–15.0)
MCH: 30.3 pg (ref 26.0–34.0)
MCHC: 32.6 g/dL (ref 30.0–36.0)
MCV: 92.8 fL (ref 80.0–100.0)
Platelets: 207 10*3/uL (ref 150–400)
RBC: 4.03 MIL/uL (ref 3.87–5.11)
RDW: 14.3 % (ref 11.5–15.5)
WBC: 4.8 10*3/uL (ref 4.0–10.5)
nRBC: 0 % (ref 0.0–0.2)

## 2021-07-07 LAB — I-STAT CHEM 8, ED
BUN: 24 mg/dL — ABNORMAL HIGH (ref 8–23)
Calcium, Ion: 1.15 mmol/L (ref 1.15–1.40)
Chloride: 107 mmol/L (ref 98–111)
Creatinine, Ser: 1 mg/dL (ref 0.44–1.00)
Glucose, Bld: 160 mg/dL — ABNORMAL HIGH (ref 70–99)
HCT: 37 % (ref 36.0–46.0)
Hemoglobin: 12.6 g/dL (ref 12.0–15.0)
Potassium: 3.6 mmol/L (ref 3.5–5.1)
Sodium: 141 mmol/L (ref 135–145)
TCO2: 23 mmol/L (ref 22–32)

## 2021-07-07 LAB — LACTIC ACID, PLASMA: Lactic Acid, Venous: 2.4 mmol/L (ref 0.5–1.9)

## 2021-07-07 LAB — ETHANOL: Alcohol, Ethyl (B): <10 mg/dL (ref <10)

## 2021-07-07 MED ORDER — FENTANYL CITRATE PF 50 MCG/ML IJ SOSY
50.0000 ug | PREFILLED_SYRINGE | Freq: Once | INTRAMUSCULAR | Status: AC
  Administered 2021-07-07: 50 ug via INTRAVENOUS
  Filled 2021-07-07: qty 1

## 2021-07-07 MED ORDER — LACTATED RINGERS IV BOLUS
1000.0000 mL | Freq: Once | INTRAVENOUS | Status: AC
  Administered 2021-07-07: 1000 mL via INTRAVENOUS

## 2021-07-07 MED ORDER — LORAZEPAM 2 MG/ML IJ SOLN
1.0000 mg | Freq: Once | INTRAMUSCULAR | Status: AC
  Administered 2021-07-07: 1 mg via INTRAVENOUS

## 2021-07-07 MED ORDER — LORAZEPAM 2 MG/ML IJ SOLN
0.5000 mg | Freq: Once | INTRAMUSCULAR | Status: DC
  Filled 2021-07-07: qty 1

## 2021-07-07 NOTE — ED Notes (Signed)
Called to activate level 2 trauma

## 2021-07-07 NOTE — ED Provider Notes (Signed)
[facility] EMERGENCY DEPARTMENT Provider Note  CSN: [id number] Arrival date & time: 07/07/21 1743  Chief Complaint(s) Fall  HPI April Carlson is a 70 y.o. female with PMH dementia, T2DM, HTN, HLD, previous PE recently completed Eliquis course 3 days ago who presents emergency department for evaluation of a mechanical fall.  Patient her first step on a staircase, slipped and fell backwards striking her head against the concrete.  She denies loss of consciousness, associated nausea, vomiting, numbness, tingling, weakness.  Endorses left-sided hip pain, right-sided shoulder pain, knee pain and leg pain   Past Medical History Past Medical History:  Diagnosis Date   Dementia (HCC)    Diabetes mellitus without complication (HCC)    High cholesterol    Hypertension    Thyroid disease    Patient Active Problem List   Diagnosis Date Noted   Dementia (HCC) 01/15/2021   DM2 (diabetes mellitus, type 2) (HCC) 01/15/2021   HTN (hypertension) 01/15/2021   Acute pulmonary embolism (HCC) 01/15/2021   Hypothyroidism 01/15/2021   Home Medication(s) Prior to Admission medications   Medication Sig Start Date End Date Taking? Authorizing Provider  alendronate (FOSAMAX) 70 MG tablet Take 70 mg by mouth once a week. Take with a full glass of water on an empty stomach.    [provider]  apixaban (ELIQUIS) 5 MG TABS tablet Take 2 tablets (10 mg total) by mouth 2 (two) times daily for 6 days. 01/18/21 01/24/21  Swayze, Ava, DO  apixaban (ELIQUIS) 5 MG TABS tablet Take 1 tablet (5 mg total) by mouth 2 (two) times daily. 01/24/21   Swayze, Ava, DO  cyanocobalamin 100 MCG tablet Take 1 tablet by mouth daily. 07/18/19   [provider]  donepezil (ARICEPT) 5 MG tablet Take 5 mg by mouth daily. 12/26/20   [provider]  levothyroxine (SYNTHROID) 75 MCG tablet Take 75 mcg by mouth daily. 12/29/20   [provider]  simvastatin (ZOCOR) 40 MG tablet Take 40 mg  by mouth at bedtime. 11/27/20   [provider]  traMADol (ULTRAM) 50 MG tablet Take 1 tablet (50 mg total) by mouth every 6 (six) hours as needed for moderate pain (Headache). 01/18/21   Swayze, Ava, DO  Vitamin D, Ergocalciferol, (DRISDOL) 1.25 MG (50000 UNIT) CAPS capsule Take 50,000 Units by mouth once a week. 10/29/20   [provider]                                                                                                                                    Past Surgical History Past Surgical History:  Procedure Laterality Date   ABDOMINAL HYSTERECTOMY     THYROIDECTOMY     Family History Family History  Problem Relation Age of Onset   Cancer Mother    Diabetes Mother    Cancer Father    Cancer Sister    Diabetes Sister    Dementia  Sister     Social History Social History   Tobacco Use   Smoking status: Former    Types: Cigarettes   Smokeless tobacco: Never  Vaping Use   Vaping Use: Never used  Substance Use Topics   Alcohol use: No   Drug use: No   Allergies Patient has no known allergies.  Review of Systems Review of Systems  Musculoskeletal:  Positive for arthralgias, myalgias and neck pain.  Neurological:  Positive for headaches.    Physical Exam Vital Signs  I have reviewed the triage vital signs BP (!) 147/92   Pulse 78   Temp 98.6 F (37 C) (Oral)   Resp (!) 24   Ht 4\' 10"  (1.473 m)   Wt 73.9 kg   SpO2 100%   BMI 34.07 kg/m   Physical Exam Vitals and nursing note reviewed.  Constitutional:      General: She is not in acute distress.    Appearance: She is well-developed.  HENT:     Head: Normocephalic and atraumatic.  Eyes:     Conjunctiva/sclera: Conjunctivae normal.  Cardiovascular:     Rate and Rhythm: Normal rate and regular rhythm.     Heart sounds: No murmur heard. Pulmonary:     Effort: Pulmonary effort is normal. No respiratory distress.     Breath sounds: Normal breath sounds.  Abdominal:      Palpations: Abdomen is soft.     Tenderness: There is no abdominal tenderness.  Musculoskeletal:        General: Tenderness present. No swelling.     Cervical back: Neck supple. Tenderness present.  Skin:    General: Skin is warm and dry.     Capillary Refill: Capillary refill takes less than 2 seconds.  Neurological:     Mental Status: She is alert.  Psychiatric:        Mood and Affect: Mood normal.     ED Results and Treatments Labs (all labs ordered are listed, but only abnormal results are displayed) Labs Reviewed  COMPREHENSIVE METABOLIC PANEL - Abnormal; Notable for the following components:      Result Value   Glucose, Bld 166 (*)    Creatinine, Ser 1.03 (*)    Total Protein 6.4 (*)    GFR, Estimated 58 (*)    All other components within normal limits  LACTIC ACID, PLASMA - Abnormal; Notable for the following components:   Lactic Acid, Venous 2.4 (*)    All other components within normal limits  I-STAT CHEM 8, ED - Abnormal; Notable for the following components:   BUN 24 (*)    Glucose, Bld 160 (*)    All other components within normal limits  RESP PANEL BY RT-PCR (FLU A&B, COVID) ARPGX2  CBC  ETHANOL  URINALYSIS, ROUTINE W REFLEX MICROSCOPIC  Radiology CT Hip Left Wo Contrast  Result Date: 07/07/2021 CLINICAL DATA:  Trauma, fall EXAM: CT OF THE LEFT HIP WITHOUT CONTRAST TECHNIQUE: Multidetector CT imaging of the left hip was performed according to the standard protocol. Multiplanar CT image reconstructions were also generated. RADIATION DOSE REDUCTION: This exam was performed according to the departmental dose-optimization program which includes automated exposure control, adjustment of the mA and/or kV according to patient size and/or use of iterative reconstruction technique. COMPARISON:  None Available. FINDINGS: Soft tissues around the left hip  are unremarkable. There is no significant effusion in the left hip joint. There is no free fluid in the visualized portions of pelvic cavity. Visualized portions of urinary bladder are unremarkable. No recent fracture is seen. There is possible minimal cortical irregularity in the left superior and inferior pubic rami possibly residual change from previous injury. Bony spurs noted in the anterior aspect of greater trochanter may be residual from previous soft tissue injury. Degenerative changes are noted in the left hip with small bony spurs and subcortical cysts. IMPRESSION: No recent displaced fracture or dislocation is seen in the left hip. Degenerative changes are noted with bony spurs and subcortical cysts in the left hip. Electronically Signed   By: Ernie Avena M.D.   On: 07/07/2021 20:32   CT Cervical Spine Wo Contrast  Result Date: 07/07/2021 CLINICAL DATA:  Trauma, fall EXAM: CT CERVICAL SPINE WITHOUT CONTRAST TECHNIQUE: Multidetector CT imaging of the cervical spine was performed without intravenous contrast. Multiplanar CT image reconstructions were also generated. RADIATION DOSE REDUCTION: This exam was performed according to the departmental dose-optimization program which includes automated exposure control, adjustment of the mA and/or kV according to patient size and/or use of iterative reconstruction technique. COMPARISON:  01/08/2021 FINDINGS: Alignment: There is minimal anterolisthesis at C3-C4 level and minimal retrolisthesis at C5-C6 level. These findings have not changed. Skull base and vertebrae: No recent fracture is seen. Smooth marginated calcifications anterior to C2 vertebra have not changed. There is partial fusion of bodies of C2 and C3 vertebrae, possibly congenital. Soft tissues and spinal canal: There is no central spinal stenosis. Disc levels: There is encroachment of neural foramina from C3 to C7 levels. Upper chest: Unremarkable. Other: Thyroid is difficult to  visualize. IMPRESSION: No recent fracture is seen. Cervical spondylosis with encroachment of neural foramina from C3-C7 levels. Other findings as described in the body of the report with no significant interval change. Electronically Signed   By: Ernie Avena M.D.   On: 07/07/2021 20:25   CT HEAD WO CONTRAST ( )  Result Date: 07/07/2021 CLINICAL DATA:  Trauma, fall EXAM: CT HEAD WITHOUT CONTRAST TECHNIQUE: Contiguous axial images were obtained from the base of the skull through the vertex without intravenous contrast. RADIATION DOSE REDUCTION: This exam was performed according to the departmental dose-optimization program which includes automated exposure control, adjustment of the mA and/or kV according to patient size and/or use of iterative reconstruction technique. COMPARISON:  01/18/2021 FINDINGS: Brain: No acute intracranial findings are seen. There are no signs of bleeding within the cranium. Ventricles are not dilated. Cortical sulci are prominent. There is no focal edema or mass effect. Vascular: Unremarkable. Skull: No fracture is seen in the calvarium. Sinuses/Orbits: There is mild mucosal thickening in the ethmoid and sphenoid sinuses. There is deviation of nasal septum to the right. Other: There is increased amount of CSF insula suggesting partial empty sella. IMPRESSION: No acute intracranial findings are seen in noncontrast CT brain. Atrophy. Mild chronic sinusitis. Electronically Signed  By: Ernie Avena M.D.   On: 07/07/2021 20:19   DG Shoulder Right  Result Date: 07/07/2021 CLINICAL DATA:  Larey Seat today EXAM: RIGHT SHOULDER - 2+ VIEW COMPARISON:  10/19/2012 FINDINGS: Osseous demineralization. AC joint alignment normal. Visualized RIGHT ribs intact. No acute fracture, dislocation, or bone destruction. IMPRESSION: No acute abnormalities. Electronically Signed   By: Ulyses Southward M.D.   On: 07/07/2021 18:50   DG Tibia/Fibula Right  Result Date: 07/07/2021 CLINICAL DATA:  Leg  and thigh pain, fell today EXAM: RIGHT TIBIA AND FIBULA - 2 VIEW COMPARISON:  None FINDINGS: Osseous demineralization. Tricompartmental osteoarthritic changes RIGHT knee. Ankle joint space preserved. No acute fracture, dislocation, or bone destruction. IMPRESSION: No acute osseous abnormalities. Osseous demineralization with degenerative changes RIGHT knee. Electronically Signed   By: Ulyses Southward M.D.   On: 07/07/2021 18:49   DG Knee 2 Views Right  Result Date: 07/07/2021 CLINICAL DATA:  Fall today, leg and thigh pain EXAM: RIGHT KNEE - 1-2 VIEW COMPARISON:  None FINDINGS: Osseous demineralization. Tricompartmental degenerative changes greatest at medial compartment. No acute fracture, dislocation, or bone destruction. No joint effusion. Scattered atherosclerotic calcifications. IMPRESSION: Osseous demineralization with tricompartmental degenerative changes RIGHT knee. No acute abnormalities. Electronically Signed   By: Ulyses Southward M.D.   On: 07/07/2021 18:48   DG Pelvis Portable  Result Date: 07/07/2021 CLINICAL DATA:  Fall.  Pain. EXAM: PORTABLE PELVIS 1-2 VIEWS COMPARISON:  01/15/2021 CT FINDINGS: Limited exam secondary to positioning and overlying wires and leads. The high pelvis is excluded. Sacroiliac joints are grossly symmetric. Femoral heads are located. Pelvic calcifications are likely phleboliths. No gross fracture. IMPRESSION: Artifact and positioned degradation. No gross acute osseous abnormality identified. Electronically Signed   By: Jeronimo Greaves M.D.   On: 07/07/2021 18:42   DG Chest Port 1 View  Result Date: 07/07/2021 CLINICAL DATA:  Trauma EXAM: PORTABLE CHEST 1 VIEW COMPARISON:  Chest x-ray dated January 15, 2021 FINDINGS: The heart size and mediastinal contours are within normal limits. Both lungs are clear. The visualized skeletal structures are unremarkable. IMPRESSION: No acute cardiopulmonary abnormality. Electronically Signed   By: Allegra Lai M.D.   On: 07/07/2021 18:40     Pertinent labs & imaging results that were available during my care of the patient were reviewed by me and considered in my medical decision making (see MDM for details).  Medications Ordered in ED Medications  fentaNYL (SUBLIMAZE) injection 50 mcg (50 mcg Intravenous Given 07/07/21 1913)  lactated ringers bolus 1,000 mL (0 mLs Intravenous Stopped 07/07/21 2112)  LORazepam (ATIVAN) injection 1 mg (1 mg Intravenous Given 07/07/21 1945)                                                                                                                                     Procedures Procedures  (including critical care time)  Medical Decision Making / ED Course   This patient presents to the  ED for concern of fall, head injury, this involves an extensive number of treatment options, and is a complaint that carries with it a high risk of complications and morbidity.  The differential diagnosis includes mechanical fall, fracture, ICH, contusion, ligamentous injury  MDM: Patient seen emergency room for evaluation of a fall with multiple traumatic complaints.  Initially a stroke alert was called given the patient's history of a fall previously on Eliquis.  Once it was discovered that the patient was no longer on Eliquis and has not taken a dose for over 72 hours, the trauma alert was downgraded to a nontrauma and evaluation continued.  Laboratory evaluation with a creatinine of 1.03, lactic acid elevated to 2.4 which was treated with 1 L lactated Ringer's but work-up otherwise unremarkable.  Trauma imaging including an x-ray of the shoulder, tib-fib, knee on the right, pelvis and chest unremarkable for traumatic findings.  Patient endorsed severe anxiety with CT scanner and thus she required anxiolysis with Ativan prior to the scan.  CT head, C-spine and CT left hip obtained given pain in the left hip to rule out occult fracture all of which were reassuringly negative for traumatic injury.  On reevaluation,  patient with improved symptoms and given negative trauma work-up she is safe for discharge with outpatient follow-up.   Additional history obtained: -Additional history obtained from daughters -External records from outside source obtained and reviewed including: Chart review including previous notes, labs, imaging, consultation notes   Lab Tests: -I ordered, reviewed, and interpreted labs.   The pertinent results include:   Labs Reviewed  COMPREHENSIVE METABOLIC PANEL - Abnormal; Notable for the following components:      Result Value   Glucose, Bld 166 (*)    Creatinine, Ser 1.03 (*)    Total Protein 6.4 (*)    GFR, Estimated 58 (*)    All other components within normal limits  LACTIC ACID, PLASMA - Abnormal; Notable for the following components:   Lactic Acid, Venous 2.4 (*)    All other components within normal limits  I-STAT CHEM 8, ED - Abnormal; Notable for the following components:   BUN 24 (*)    Glucose, Bld 160 (*)    All other components within normal limits  RESP PANEL BY RT-PCR (FLU A&B, COVID) ARPGX2  CBC  ETHANOL  URINALYSIS, ROUTINE W REFLEX MICROSCOPIC      EKG   EKG Interpretation  Date/Time:  Tuesday July 07 2021 18:10:12 EDT Ventricular Rate:  74 PR Interval:  199 QRS Duration: 96 QT Interval:  385 QTC Calculation: 428 R Axis:   -16 Text Interpretation: Sinus rhythm Ventricular premature complex Confirmed by Akiko Schexnider ([id number]) on 07/07/2021 10:46:51 PM         Imaging Studies ordered: I ordered imaging studies including multiple trauma x-rays, CT head, C-spine, hip I independently visualized and interpreted imaging. I agree with the radiologist interpretation   Medicines ordered and prescription drug management: Meds ordered this encounter  Medications   DISCONTD: LORazepam (ATIVAN) injection 0.5 mg   fentaNYL (SUBLIMAZE) injection 50 mcg   lactated ringers bolus 1,000 mL   LORazepam (ATIVAN) injection 1 mg    -I have reviewed the  patients home medicines and have made adjustments as needed  Critical interventions none     Cardiac Monitoring: The patient was maintained on a cardiac monitor.  I personally viewed and interpreted the cardiac monitored which showed an underlying rhythm of: NSR  Social Determinants of Health:  Factors impacting patients  care include: none   Reevaluation: After the interventions noted above, I reevaluated the patient and found that they have :improved  Co morbidities that complicate the patient evaluation  Past Medical History:  Diagnosis Date   Dementia (HCC)    Diabetes mellitus without complication (HCC)    High cholesterol    Hypertension    Thyroid disease       Dispostion: I considered admission for this patient, but with negative trauma work-up, patient safe for discharge patient follow-up     Final Clinical Impression(s) / ED Diagnoses Final diagnoses:  Fall, initial encounter  Minor head injury, initial encounter     @PCDICTATION @    Rein Popov, Wyn Forster, MD 07/07/21 [phone]

## 2021-07-07 NOTE — ED Notes (Signed)
Patient transported to CT 

## 2021-07-07 NOTE — ED Triage Notes (Signed)
Patient had fall today striking back of head patient was on eliquis and last dose was Sunday night also complaining of left leg/thigh pain.

## 2021-08-01 ENCOUNTER — Encounter (HOSPITAL_BASED_OUTPATIENT_CLINIC_OR_DEPARTMENT_OTHER): Payer: Self-pay | Admitting: Emergency Medicine

## 2021-08-01 ENCOUNTER — Emergency Department (HOSPITAL_BASED_OUTPATIENT_CLINIC_OR_DEPARTMENT_OTHER): Payer: Medicare Other

## 2021-08-01 ENCOUNTER — Emergency Department (HOSPITAL_BASED_OUTPATIENT_CLINIC_OR_DEPARTMENT_OTHER)
Admission: EM | Admit: 2021-08-01 | Discharge: 2021-08-01 | Disposition: A | Discharge status: Home / Self Care | Payer: Medicare Other | Attending: Emergency Medicine | Admitting: Emergency Medicine

## 2021-08-01 DIAGNOSIS — W19XXXA Unspecified fall, initial encounter: Secondary | ICD-10-CM | POA: Diagnosis not present

## 2021-08-01 DIAGNOSIS — Z86711 Personal history of pulmonary embolism: Secondary | ICD-10-CM | POA: Insufficient documentation

## 2021-08-01 DIAGNOSIS — Z7901 Long term (current) use of anticoagulants: Secondary | ICD-10-CM | POA: Insufficient documentation

## 2021-08-01 DIAGNOSIS — I10 Essential (primary) hypertension: Secondary | ICD-10-CM | POA: Insufficient documentation

## 2021-08-01 DIAGNOSIS — Z79899 Other long term (current) drug therapy: Secondary | ICD-10-CM | POA: Diagnosis not present

## 2021-08-01 DIAGNOSIS — F0390 Unspecified dementia without behavioral disturbance: Secondary | ICD-10-CM | POA: Diagnosis not present

## 2021-08-01 DIAGNOSIS — M25561 Pain in right knee: Secondary | ICD-10-CM | POA: Insufficient documentation

## 2021-08-01 DIAGNOSIS — E119 Type 2 diabetes mellitus without complications: Secondary | ICD-10-CM | POA: Insufficient documentation

## 2021-08-01 NOTE — ED Notes (Signed)
Assisted pt to bathroom by wheelchair for pt convenience

## 2021-08-01 NOTE — ED Notes (Signed)
Pt able to get from wheelchair to bed without assistance.

## 2021-08-01 NOTE — ED Notes (Signed)
Pt ambulatory with standby assistance

## 2021-08-01 NOTE — ED Provider Notes (Signed)
[facility] EMERGENCY DEPARTMENT Provider Note   CSN: [id number] Arrival date & time: 08/01/21  1644     History  Chief Complaint  Patient presents with   Knee Pain    April Carlson is a 71 y.o. female.  Patient with PMH of dementia, T2DM, HTN, HLD, previous PE recently completed Eliquis course presents today with complaints of fall. States that earlier today the patient was trying to get to her rollator that she uses to get around at baseline and stumbled and fell onto her right knee. She denies hitting her head or loss of consciousness. She is not anticoagulated. Patients granddaughter states the patient has been hesitant to walk around since then due to pain in her knee.  The history is provided by the patient. No language interpreter was used.  Knee Pain      Home Medications Prior to Admission medications   Medication Sig Start Date End Date Taking? Authorizing Provider  alendronate (FOSAMAX) 70 MG tablet Take 70 mg by mouth once a week. Take with a full glass of water on an empty stomach.    [provider]  apixaban (ELIQUIS) 5 MG TABS tablet Take 2 tablets (10 mg total) by mouth 2 (two) times daily for 6 days. 01/18/21 01/24/21  Swayze, Ava, DO  apixaban (ELIQUIS) 5 MG TABS tablet Take 1 tablet (5 mg total) by mouth 2 (two) times daily. 01/24/21   Swayze, Ava, DO  cyanocobalamin 100 MCG tablet Take 1 tablet by mouth daily. 07/18/19   [provider]  donepezil (ARICEPT) 5 MG tablet Take 5 mg by mouth daily. 12/26/20   [provider]  levothyroxine (SYNTHROID) 75 MCG tablet Take 75 mcg by mouth daily. 12/29/20   [provider]  simvastatin (ZOCOR) 40 MG tablet Take 40 mg by mouth at bedtime. 11/27/20   [provider]  traMADol (ULTRAM) 50 MG tablet Take 1 tablet (50 mg total) by mouth every 6 (six) hours as needed for moderate pain (Headache). 01/18/21   Swayze, Ava, DO  Vitamin D, Ergocalciferol, (DRISDOL) 1.25 MG (50000  UNIT) CAPS capsule Take 50,000 Units by mouth once a week. 10/29/20   [provider]      Allergies    Patient has no known allergies.    Review of Systems   Review of Systems  Musculoskeletal:  Positive for arthralgias.  All other systems reviewed and are negative.   Physical Exam Updated Vital Signs BP (!) 178/74 (BP Location: Right Arm)   Pulse 60   Temp 98.5 F (36.9 C) (Oral)   Resp 16   Ht 4\' 9"  (1.448 m)   Wt 74.8 kg   SpO2 98%   BMI 35.71 kg/m  Physical Exam Vitals and nursing note reviewed.  Constitutional:      General: She is not in acute distress.    Appearance: Normal appearance. She is normal weight. She is not ill-appearing, toxic-appearing or diaphoretic.  HENT:     Head: Normocephalic and atraumatic.  Cardiovascular:     Rate and Rhythm: Normal rate.  Pulmonary:     Effort: Pulmonary effort is normal. No respiratory distress.  Musculoskeletal:        General: Normal range of motion.     Cervical back: Normal range of motion.     Comments: Mild tenderness to palpation of the right knee without any obvious bruising or deformity. DP and PT pulses intact and 2+. ROM intact  Patient able to ambulate in the hallway  and bear weight on the right leg with 1 assist per baseline  Skin:    General: Skin is warm and dry.  Neurological:     General: No focal deficit present.     Mental Status: She is alert. Mental status is at baseline.  Psychiatric:        Mood and Affect: Mood normal.        Behavior: Behavior normal.     ED Results / Procedures / Treatments   Labs (all labs ordered are listed, but only abnormal results are displayed) Labs Reviewed - No data to display  EKG None  Radiology DG Knee Complete 4 Views Right  Result Date: 08/01/2021 CLINICAL DATA:  Status post fall landing on right knee. Pain which is worse with ambulation. EXAM: RIGHT KNEE - COMPLETE 4+ VIEW COMPARISON:  07/07/2021. FINDINGS: Diffuse osteopenia. No joint  effusion identified. No signs of acute fracture or dislocation. Tricompartment osteoarthritis is noted. This is most severe within the medial compartment vascular calcifications noted. IMPRESSION: 1. No acute findings. 2. Tricompartment osteoarthritis. Electronically Signed   By: Signa Kell M.D.   On: 08/01/2021 17:37    Procedures Procedures    Medications Ordered in ED Medications - No data to display  ED Course/ Medical Decision Making/ A&P                           Medical Decision Making Amount and/or Complexity of Data Reviewed Radiology: ordered.   Patient presents today after mechanical fall earlier today. She is afebrile, non-toxic appearing, and in no acute distress with reassuring vital signs. Patient X-Ray negative for obvious fracture or dislocation. I have personally reviewed this imaging and agree with radiology interpretation. Physical exam reveals no abnormalities, mild tenderness without obvious swelling or bruising. She is able to ambulate per her baseline without difficulty. Patient given knee brace for support,  conservative therapy recommended and discussed. Pt advised to follow up with orthopedics if symptoms persist for possibility of missed fracture diagnosis. Also recommend close PCP follow-up. She is otherwise stable for discharge. She and her granddaughter are understanding and amenable with plan, educated on red flag symptoms that would prompt immediate return. Discharged in stable condition.    This is a shared visit with supervising physician Dr. Stevie Kern who has independently evaluated patient & provided guidance in evaluation/management/disposition, in agreement with care    Final Clinical Impression(s) / ED Diagnoses Final diagnoses:  Fall, initial encounter  Acute pain of right knee    Rx / DC Orders ED Discharge Orders     None     An After Visit Summary was printed and given to the patient.     Vear Clock 08/01/21 1827     Milagros Loll, MD 08/01/21 2037

## 2021-08-01 NOTE — Discharge Instructions (Signed)
X-ray imaging of your knee was negative for fracture or dislocation. As we discussed, this does not rule out a ligament injury. I have give you a brace for support of your knee and recommend that you wear it as needed for support. You may also take Tylenol for pain. I also recommend that you rest, ice, compress, and elevate your knee for additional support. I have given you a referral to ortho to follow-up with as needed.   Return if development of any new or worsening symptoms

## 2021-08-01 NOTE — ED Notes (Signed)
ED Provider at bedside. 

## 2021-08-01 NOTE — ED Notes (Signed)
Patient Alert and oriented to baseline. Stable and ambulatory to baseline. Patient verbalized understanding of the discharge instructions.  Patient belongings were taken by the patient.   

## 2021-08-01 NOTE — ED Triage Notes (Signed)
Per family, pt was complaining of R knee pain. Does not want to move R knee or put weight. Unsure of injury. Complaining of R knee pain in triage.

## 2021-08-11 ENCOUNTER — Other Ambulatory Visit: Payer: Self-pay

## 2021-08-11 ENCOUNTER — Emergency Department (HOSPITAL_COMMUNITY): Payer: Medicare Other

## 2021-08-11 ENCOUNTER — Emergency Department (HOSPITAL_COMMUNITY)
Admission: EM | Admit: 2021-08-11 | Discharge: 2021-08-11 | Disposition: A | Discharge status: Home / Self Care | Payer: Medicare Other | Attending: Emergency Medicine | Admitting: Emergency Medicine

## 2021-08-11 DIAGNOSIS — Y92009 Unspecified place in unspecified non-institutional (private) residence as the place of occurrence of the external cause: Secondary | ICD-10-CM | POA: Diagnosis not present

## 2021-08-11 DIAGNOSIS — M542 Cervicalgia: Secondary | ICD-10-CM | POA: Diagnosis not present

## 2021-08-11 DIAGNOSIS — R519 Headache, unspecified: Secondary | ICD-10-CM | POA: Insufficient documentation

## 2021-08-11 DIAGNOSIS — W01198A Fall on same level from slipping, tripping and stumbling with subsequent striking against other object, initial encounter: Secondary | ICD-10-CM | POA: Diagnosis not present

## 2021-08-11 LAB — CBC WITH DIFFERENTIAL/PLATELET
Abs Immature Granulocytes: 0.01 10*3/uL (ref 0.00–0.07)
Basophils Absolute: 0.1 10*3/uL (ref 0.0–0.1)
Basophils Relative: 1 %
Eosinophils Absolute: 0 10*3/uL (ref 0.0–0.5)
Eosinophils Relative: 1 %
HCT: 35.5 % — ABNORMAL LOW (ref 36.0–46.0)
Hemoglobin: 11.4 g/dL — ABNORMAL LOW (ref 12.0–15.0)
Immature Granulocytes: 0 %
Lymphocytes Relative: 37 %
Lymphs Abs: 1.8 10*3/uL (ref 0.7–4.0)
MCH: 29.7 pg (ref 26.0–34.0)
MCHC: 32.1 g/dL (ref 30.0–36.0)
MCV: 92.4 fL (ref 80.0–100.0)
Monocytes Absolute: 0.5 10*3/uL (ref 0.1–1.0)
Monocytes Relative: 9 %
Neutro Abs: 2.6 10*3/uL (ref 1.7–7.7)
Neutrophils Relative %: 52 %
Platelets: 201 10*3/uL (ref 150–400)
RBC: 3.84 MIL/uL — ABNORMAL LOW (ref 3.87–5.11)
RDW: 14.4 % (ref 11.5–15.5)
WBC: 5 10*3/uL (ref 4.0–10.5)
nRBC: 0 % (ref 0.0–0.2)

## 2021-08-11 LAB — COMPREHENSIVE METABOLIC PANEL
ALT: 22 U/L (ref 0–44)
AST: 24 U/L (ref 15–41)
Albumin: 3.6 g/dL (ref 3.5–5.0)
Alkaline Phosphatase: 47 U/L (ref 38–126)
Anion gap: 5 (ref 5–15)
BUN: 25 mg/dL — ABNORMAL HIGH (ref 8–23)
CO2: 25 mmol/L (ref 22–32)
Calcium: 9.1 mg/dL (ref 8.9–10.3)
Chloride: 107 mmol/L (ref 98–111)
Creatinine, Ser: 0.92 mg/dL (ref 0.44–1.00)
GFR, Estimated: >60 mL/min (ref >60)
Glucose, Bld: 110 mg/dL — ABNORMAL HIGH (ref 70–99)
Potassium: 4.2 mmol/L (ref 3.5–5.1)
Sodium: 137 mmol/L (ref 135–145)
Total Bilirubin: 0.4 mg/dL (ref 0.3–1.2)
Total Protein: 5.9 g/dL — ABNORMAL LOW (ref 6.5–8.1)

## 2021-08-11 LAB — LIPASE, BLOOD: Lipase: 37 U/L (ref 11–51)

## 2021-08-11 MED ORDER — TRAMADOL HCL 50 MG PO TABS
50.0000 mg | ORAL_TABLET | Freq: Once | ORAL | Status: AC
  Administered 2021-08-11: 50 mg via ORAL
  Filled 2021-08-11: qty 1

## 2021-08-11 MED ORDER — LORAZEPAM 2 MG/ML IJ SOLN
1.0000 mg | Freq: Once | INTRAMUSCULAR | Status: AC
  Administered 2021-08-11: 1 mg via INTRAVENOUS
  Filled 2021-08-11: qty 1

## 2021-08-11 MED ORDER — ACETAMINOPHEN 500 MG PO TABS
1000.0000 mg | ORAL_TABLET | Freq: Once | ORAL | Status: AC
  Administered 2021-08-11: 1000 mg via ORAL
  Filled 2021-08-11: qty 2

## 2021-08-11 NOTE — ED Provider Notes (Signed)
[facility] EMERGENCY DEPARTMENT Provider Note   CSN: [id number] Arrival date & time: 08/11/21  1758     History Chief Complaint  Patient presents with   Fall     April Carlson is a 71 y.o. female presenting for GLF. Patient states that she was on her porch when she lost her balance and fell over backwards.  She landed hitting her back.  She denies fevers chills nausea vomiting syncope shortness of breath.  She was asymptomatic prior to the fall.  States that she has had similar falls in the past.  Has a home health nurse and a granddaughter who was involved in her daily care.  She is currently endorsing headache and neck pain with no other symptoms.  Her granddaughter states that patient was pulled off of Eliquis approximately 2 months ago.  HPI     Home Medications Prior to Admission medications   Medication Sig Start Date End Date Taking? Authorizing Provider  alendronate (FOSAMAX) 70 MG tablet Take 70 mg by mouth once a week. Take with a full glass of water on an empty stomach.   Yes [provider]  cyanocobalamin 100 MCG tablet Take 1 tablet by mouth daily. 07/18/19  Yes [provider]  donepezil (ARICEPT) 5 MG tablet Take 5 mg by mouth daily. 12/26/20  Yes [provider]  levothyroxine (SYNTHROID) 75 MCG tablet Take 75 mcg by mouth daily. 12/29/20  Yes [provider]  simvastatin (ZOCOR) 40 MG tablet Take 40 mg by mouth at bedtime. 11/27/20  Yes [provider]  Vitamin D, Ergocalciferol, (DRISDOL) 1.25 MG (50000 UNIT) CAPS capsule Take 50,000 Units by mouth once a week. 10/29/20  Yes [provider]  apixaban (ELIQUIS) 5 MG TABS tablet Take 2 tablets (10 mg total) by mouth 2 (two) times daily for 6 days. Patient not taking: Reported on 08/11/2021 01/18/21 01/24/21  Swayze, Ava, DO  apixaban (ELIQUIS) 5 MG TABS tablet Take 1 tablet (5 mg total) by mouth 2 (two) times daily. Patient not taking: Reported on  08/11/2021 01/24/21   Swayze, Ava, DO  traMADol (ULTRAM) 50 MG tablet Take 1 tablet (50 mg total) by mouth every 6 (six) hours as needed for moderate pain (Headache). Patient not taking: Reported on 08/11/2021 01/18/21   Swayze, Ava, DO      Allergies    Patient has no known allergies.    Review of Systems   Review of Systems  Constitutional:  Negative for chills and fever.  HENT:  Negative for ear pain and sore throat.   Eyes:  Negative for pain and visual disturbance.  Respiratory:  Negative for cough and shortness of breath.   Cardiovascular:  Negative for chest pain and palpitations.  Gastrointestinal:  Negative for abdominal pain and vomiting.  Genitourinary:  Negative for dysuria and hematuria.  Musculoskeletal:  Negative for arthralgias and back pain.  Skin:  Negative for color change and rash.  Neurological:  Positive for headaches. Negative for seizures and syncope.  All other systems reviewed and are negative.   Physical Exam Updated Vital Signs BP (!) 111/94   Pulse 69   Temp 98.1 F (36.7 C) (Oral)   Resp 17   SpO2 100%  Physical Exam Physical Exam  Neurologic: GCS 15, motor intact in all four extremities, sensory intact in all 4 extremities  Head: Pupils are 18mm, equally round and reactive to light, patient has no obvious facial trauma, no hemotympanum  Neck: patient has no midline  neck tenderness, no obvious injuries.  Thorax: Patient has stable clavicles, stable thorax with bilateral chest rise and breath sounds heard.  No penetrating thoracic injury.  CV/Pulm: RRR, no audible murmer/rubs/gallops, CTAB  Abdomen: Patient has no abdominal distention, no penetrating abdominal injury.  Back: Patient has no midline spinal tenderness in the thoracic and lumbar spine, patient has no paraspinal tenderness bilaterally.  Pelvis: Patient has a stable pelvis to compression with palpable femoral pulses.  Extremities:Patient's upper extremities with no obvious injury or  abnormality, radial pulses present. Patient's lower extremities with no obvious injury or abnormality, tibial pulses present.   ED Course/ Medical Decision Making/ A&P    Procedures Procedures   Medications Ordered in ED Medications  acetaminophen (TYLENOL) tablet 1,000 mg (1,000 mg Oral Given 08/11/21 1837)  traMADol (ULTRAM) tablet 50 mg (50 mg Oral Given 08/11/21 1857)  LORazepam (ATIVAN) injection 1 mg (1 mg Intravenous Given 08/11/21 1927)   Medical Decision Making:    April Carlson is a 71 y.o. female who presented to the ED today with a moderate mechanisma trauma, detailed above.    Additional history discussed with patient's family/caregivers.  Patient's presentation is complicated by their history of frequent falls, .  Patient placed on continuous vitals and telemetry monitoring while in ED which was reviewed periodically.   Given this mechanism of trauma, a full physical exam was performed. Notably, patient was HDS in NAD.   Reviewed and confirmed nursing documentation for past medical history, family history, social history.    Initial Assessment/Plan:   With the patient's presentation of moderate mechanism trauma and abnormalities detailed above, patient warrants aggressive evaluation for potential traumatic injuries. Will proceed with non-level trauma protocol to evaluate for potential injuries. Will proceed with CT Head, Cervical/Thoracic/Lumbar Spine. Will include chest and pelvis x-ray for further evaluation for intrathoracic or intra-abdominal injury.  Additionally will evaluate with screening labs including CBC, CMP, lipase.  Scans resulted with NAA.   Final Reassessment and Plan:    On reassessment, patient is grossly asymptomatic at this time.  She states that her headache has resolved and she feels like she would be able to walk without dizziness.  She states that she was not using her physical assist devices and her granddaughter at bedside confirms this.  Otherwise  patient is asymptomatic with no acute injuries at this time.  She feels like she would be able to walk.  Ultimately, patient stable for continued outpatient care and management.  Reinforced importance of close outpatient follow-up with PCP for reevaluation given frequent falls.  Patient expressed understanding and was discharged with no further acute events.   Disposition:  Based on the above findings, I believe patient is stable for discharge.    Patient/family educated about specific return precautions for given chief complaint and symptoms.  Patient/family educated about follow-up with PCP.     Patient/family expressed understanding of return precautions and need for follow-up. Patient spoken to regarding all imaging and laboratory results and appropriate follow up for these results. All education provided in verbal form with additional information in written form. Time was allowed for answering of patient questions. Patient discharged.    Emergency Department Medication Summary:   Medications  acetaminophen (TYLENOL) tablet 1,000 mg (1,000 mg Oral Given 08/11/21 1837)  traMADol (ULTRAM) tablet 50 mg (50 mg Oral Given 08/11/21 1857)  LORazepam (ATIVAN) injection 1 mg (1 mg Intravenous Given 08/11/21 1927)               Clinical  Impression:  1. Fall in home, initial encounter         Discharge   Final Clinical Impression(s) / ED Diagnoses Final diagnoses:  Fall in home, initial encounter    Rx / DC Orders ED Discharge Orders     None         Glyn Ade, MD 08/11/21 2031

## 2021-08-11 NOTE — ED Notes (Signed)
CT called to say that pt is having a slight panic attack while trying to get CT scans. MD notified and Ativan given

## 2021-08-11 NOTE — Discharge Instructions (Signed)
You were seen today for fall.  Your extensive evaluation has been without acute fracture or injury.  Your laboratory evaluation is grossly reassuring at this time.  I recommend you follow-up closely with primary care provider and use your physical therapy assist devices as we discussed.  You may have ongoing headache, it should be mild and responsive to your home regimen.  If something changes, please return for reevaluation and ongoing care and management.

## 2021-08-11 NOTE — ED Triage Notes (Signed)
Pt BIB EMS due to a fall. Pt stopped blood thinner 3 weeks ago. Pt denies LOC. Pt comes with c-collar and states the back of her head hurts. Pt is axox4. VSS. Pt states she has been dizzy all day.

## 2021-08-11 NOTE — ED Notes (Signed)
Pt back in room - hooked up to monitor - MD at bedside

## 2021-08-11 NOTE — ED Notes (Signed)
Patient verbalizes understanding of discharge instructions. Opportunity for questioning and answers were provided. Armband removed by staff, pt discharged from ED via wheelchair.  

## 2022-01-22 IMAGING — CT CT HEAD W/O CM
3 series · 16 of 47 positions shown, 19 images · non-contrast
Comparison: 01/15/2021.

CLINICAL DATA: Headache.

EXAM:
CT HEAD WITHOUT CONTRAST
TECHNIQUE: Contiguous axial images were obtained from the base of the skull
through the vertex without intravenous contrast.

[Series 4: head 5.0 h30s · axial · 0.42mm/px · z∈[-58,+72]mm · 10 of 32 slices shown, 13 images]
[im 3/32  brain]
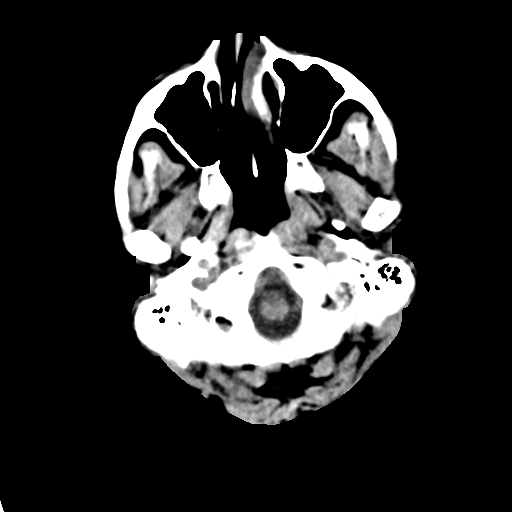
[im 3/32  bone]
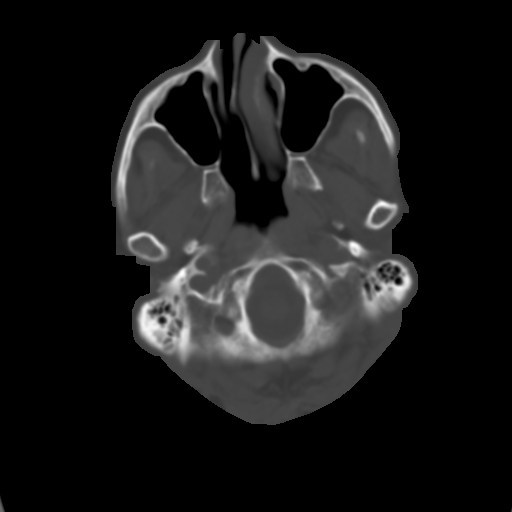
[im 6/32  brain]
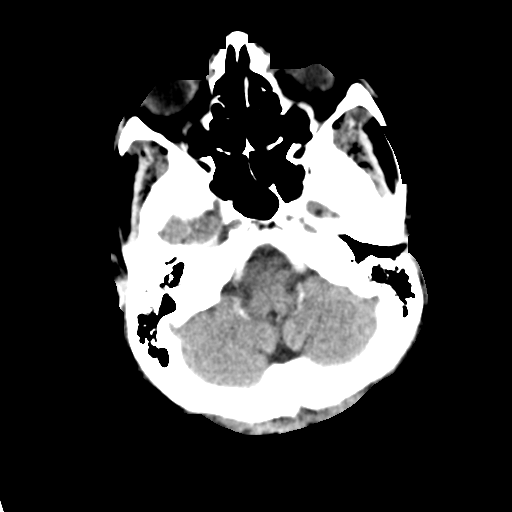
[im 9/32  brain]
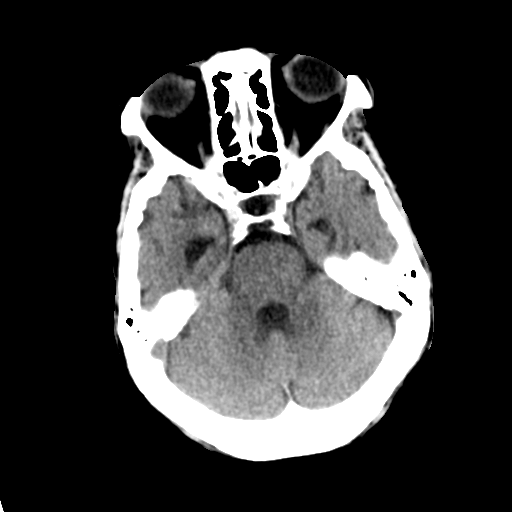
[im 11/32  brain]
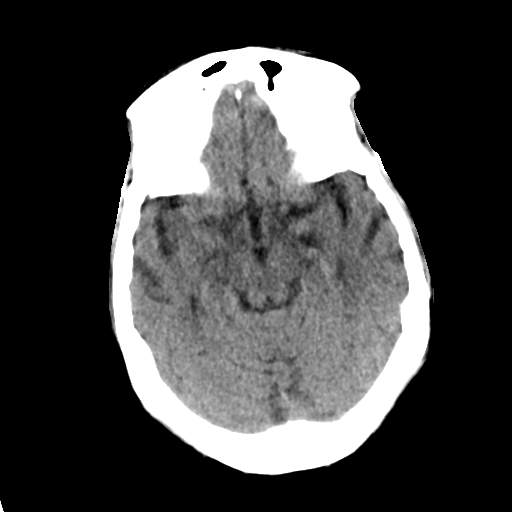
[im 14/32  brain]
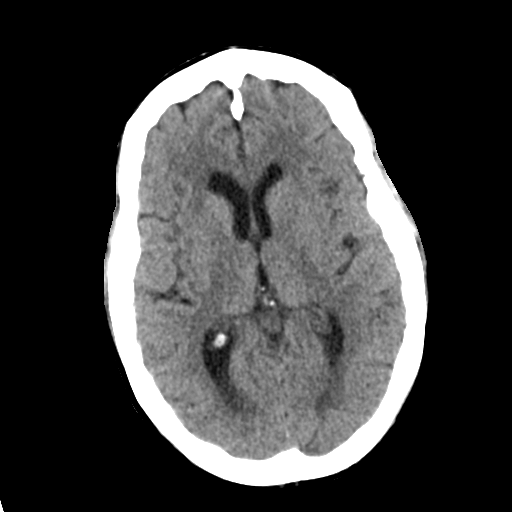
[im 14/32  bone]
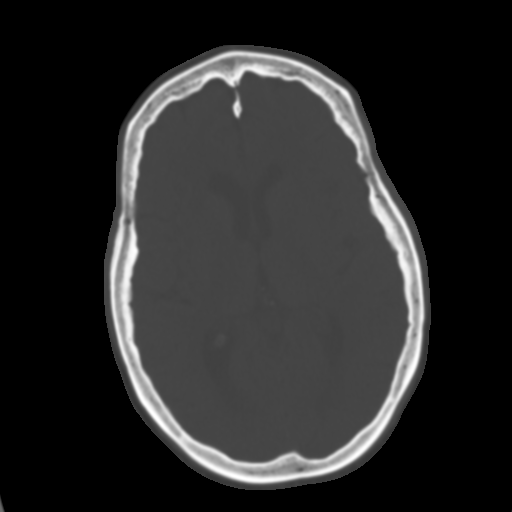
[im 18/32  brain]
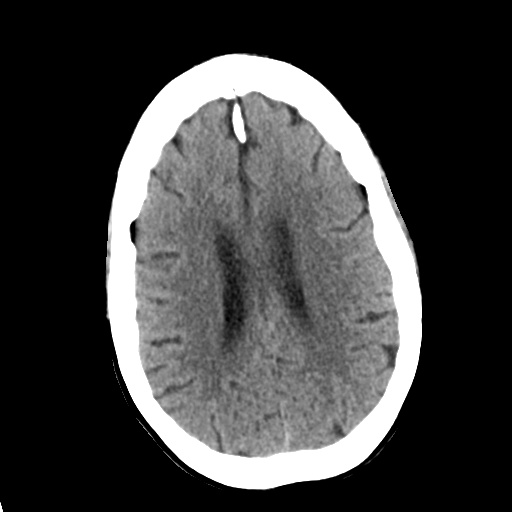
[im 21/32  brain]
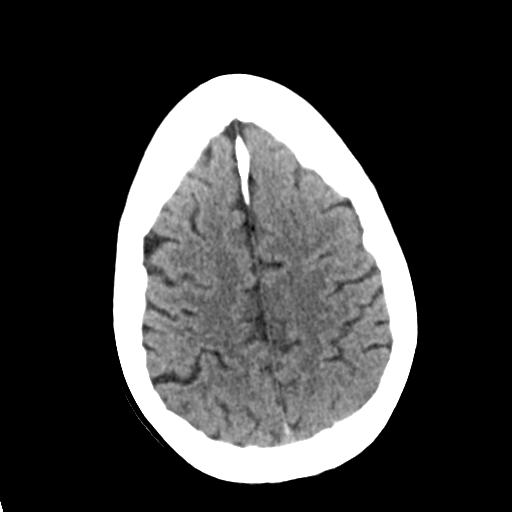
[im 24/32  brain]
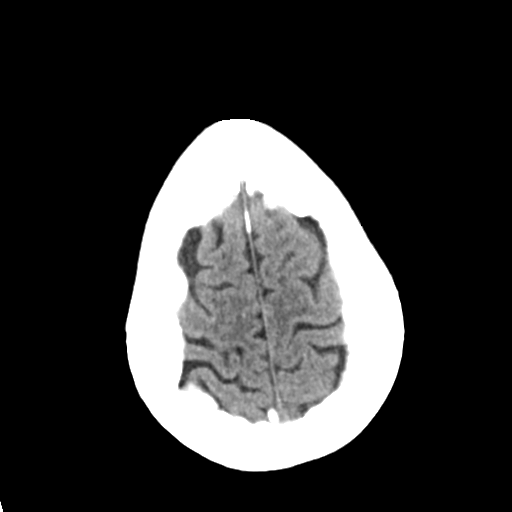
[im 26/32  brain]
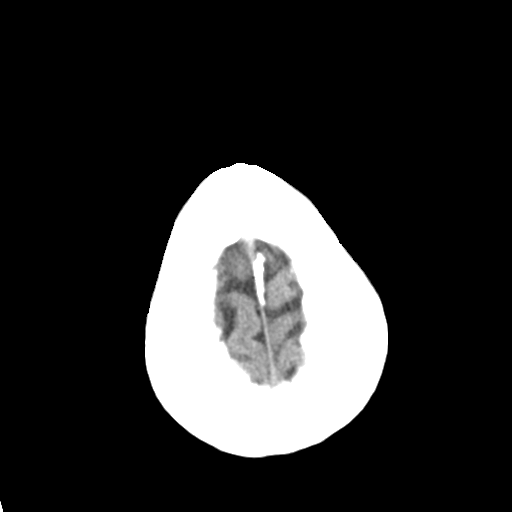
[im 26/32  bone]
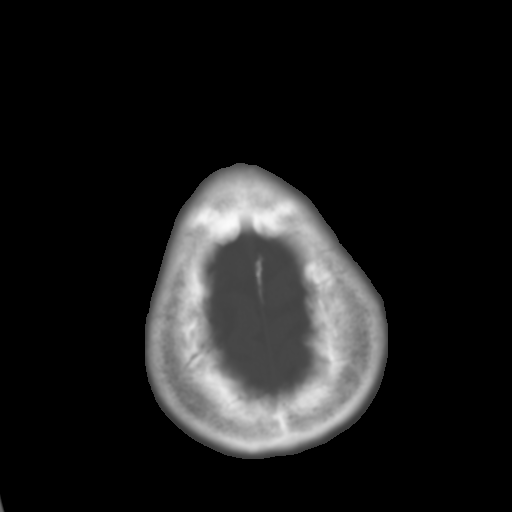
[im 29/32  brain]
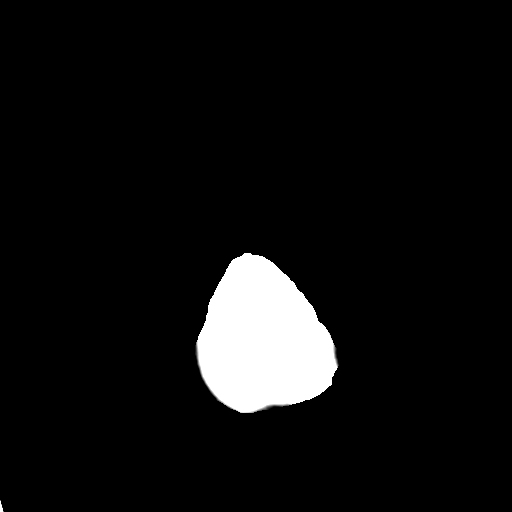

[Series 5: head 3.0 mpr cor · coronal · 0.32mm/px · 3 of 67 slices shown]
[im 23/67  brain]
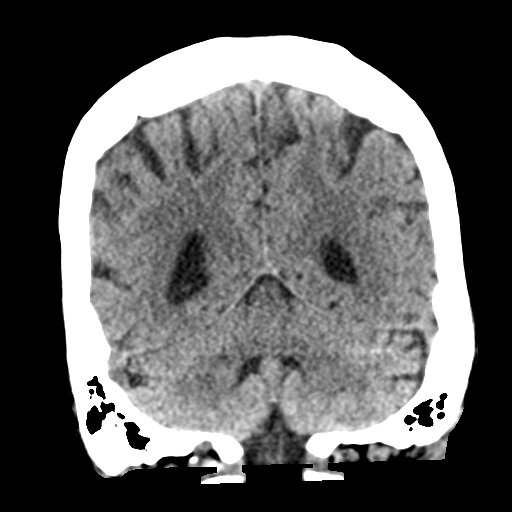
[im 30/67  brain]
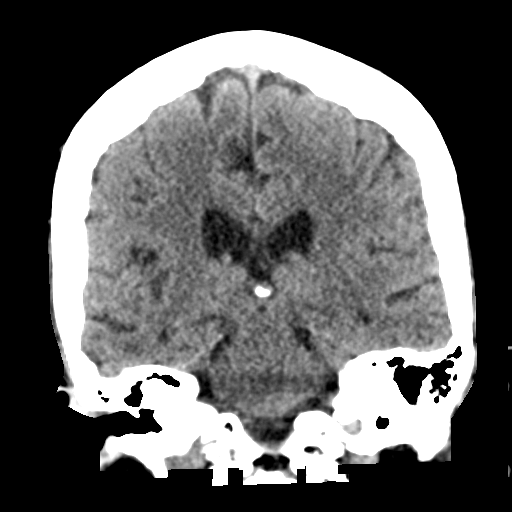
[im 37/67  brain]
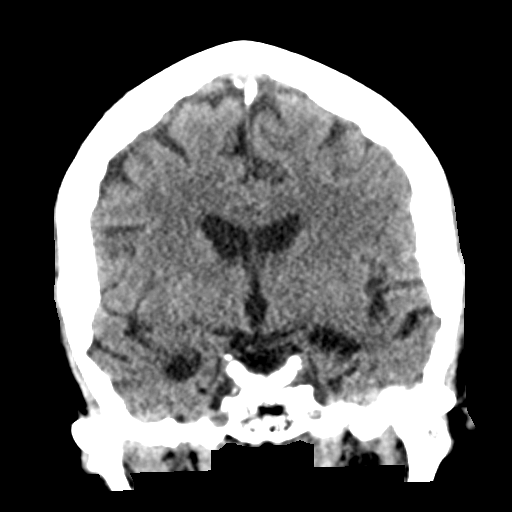

[Series 6: head 3.0 mpr sag · sagittal · 0.30mm/px · 3 of 51 slices shown]
[im 17/51  brain]
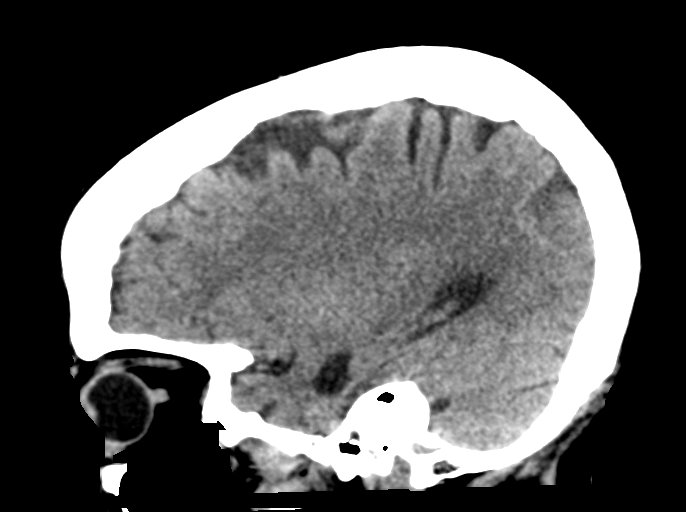
[im 26/51  brain]
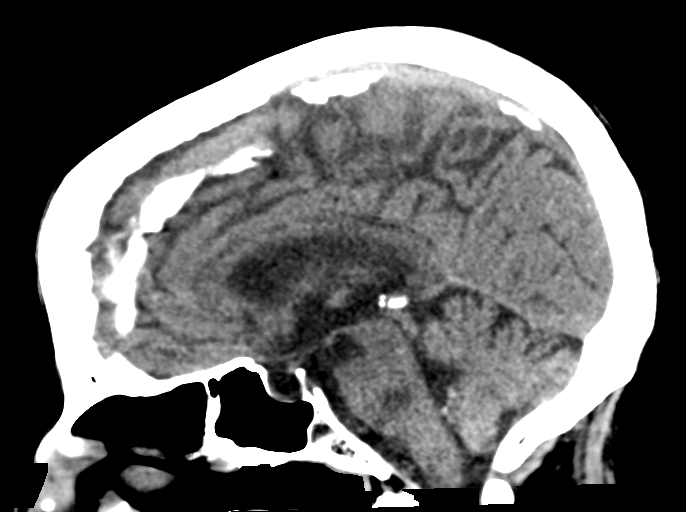
[im 34/51  brain]
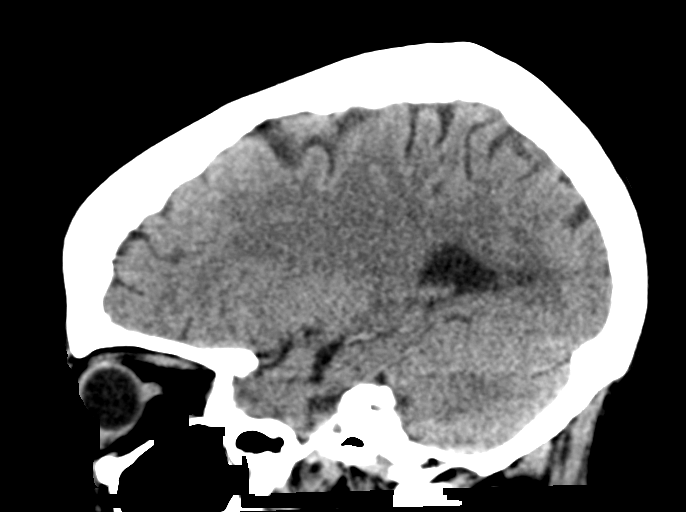

[16 of 47 positions shown; findings below may reference images not displayed]

FINDINGS: Brain: No evidence of acute infarction, hemorrhage, hydrocephalus,
extra-axial collection or mass lesion/mass effect.

Vascular: No hyperdense vessel or unexpected calcification.

Skull: Normal. Negative for fracture or focal lesion.

Sinuses/Orbits: Globes and orbits are unremarkable. Visualized
sinuses are clear.

Other: None.
IMPRESSION: 1. No acute intracranial abnormalities. Stable appearance from the
recent prior study.
